# Patient Record
Sex: Female | Born: 1990 | Hispanic: No | Marital: Single | Smoking: Current every day smoker
Health system: Southern US, Community
[De-identification: ages and names within clinical notes are randomized; demographics above are authoritative.]

## PROBLEM LIST (undated history)

## (undated) ENCOUNTER — Emergency Department (HOSPITAL_COMMUNITY): Payer: No Typology Code available for payment source

## (undated) ENCOUNTER — Emergency Department (HOSPITAL_COMMUNITY): Payer: Medicaid Other

## (undated) DIAGNOSIS — A419 Sepsis, unspecified organism: Secondary | ICD-10-CM

## (undated) DIAGNOSIS — B182 Chronic viral hepatitis C: Secondary | ICD-10-CM

## (undated) DIAGNOSIS — Z8679 Personal history of other diseases of the circulatory system: Secondary | ICD-10-CM

## (undated) DIAGNOSIS — F1910 Other psychoactive substance abuse, uncomplicated: Secondary | ICD-10-CM

## (undated) HISTORY — PX: BLADDER SURGERY: SHX569

---
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
## 2016-07-28 DIAGNOSIS — N189 Chronic kidney disease, unspecified: Secondary | ICD-10-CM

## 2016-07-28 HISTORY — DX: Chronic kidney disease, unspecified: N18.9

## 2020-01-02 ENCOUNTER — Other Ambulatory Visit: Payer: Self-pay

## 2020-01-02 ENCOUNTER — Encounter (HOSPITAL_COMMUNITY)
Admission: EM | Disposition: A | Discharge status: Home / Self Care | Payer: Self-pay | Source: Home / Self Care | Attending: Emergency Medicine

## 2020-01-02 ENCOUNTER — Other Ambulatory Visit (HOSPITAL_COMMUNITY): Payer: Medicaid Other

## 2020-01-02 ENCOUNTER — Ambulatory Visit (HOSPITAL_COMMUNITY)
Admission: EM | Admit: 2020-01-02 | Discharge: 2020-01-02 | Disposition: A | Discharge status: Home / Self Care | Payer: Medicaid Other | Attending: Emergency Medicine | Admitting: Emergency Medicine

## 2020-01-02 ENCOUNTER — Encounter (HOSPITAL_COMMUNITY): Payer: Self-pay | Admitting: Obstetrics and Gynecology

## 2020-01-02 ENCOUNTER — Emergency Department (HOSPITAL_COMMUNITY): Payer: Medicaid Other | Admitting: Certified Registered Nurse Anesthetist

## 2020-01-02 ENCOUNTER — Emergency Department (HOSPITAL_COMMUNITY): Payer: Medicaid Other

## 2020-01-02 DIAGNOSIS — N939 Abnormal uterine and vaginal bleeding, unspecified: Secondary | ICD-10-CM | POA: Diagnosis not present

## 2020-01-02 DIAGNOSIS — O071 Delayed or excessive hemorrhage following failed attempted termination of pregnancy: Secondary | ICD-10-CM | POA: Insufficient documentation

## 2020-01-02 DIAGNOSIS — O034 Incomplete spontaneous abortion without complication: Secondary | ICD-10-CM

## 2020-01-02 DIAGNOSIS — O046 Delayed or excessive hemorrhage following (induced) termination of pregnancy: Secondary | ICD-10-CM

## 2020-01-02 DIAGNOSIS — Z20822 Contact with and (suspected) exposure to covid-19: Secondary | ICD-10-CM | POA: Diagnosis not present

## 2020-01-02 DIAGNOSIS — R109 Unspecified abdominal pain: Secondary | ICD-10-CM | POA: Diagnosis not present

## 2020-01-02 DIAGNOSIS — D649 Anemia, unspecified: Secondary | ICD-10-CM

## 2020-01-02 DIAGNOSIS — IMO0002 Reserved for concepts with insufficient information to code with codable children: Secondary | ICD-10-CM

## 2020-01-02 DIAGNOSIS — Z9889 Other specified postprocedural states: Secondary | ICD-10-CM

## 2020-01-02 HISTORY — PX: DILATION AND EVACUATION: SHX1459

## 2020-01-02 HISTORY — DX: Other psychoactive substance abuse, uncomplicated: F19.10

## 2020-01-02 HISTORY — DX: Chronic viral hepatitis C: B18.2

## 2020-01-02 LAB — CBC
HCT: 27.7 % — ABNORMAL LOW (ref 36.0–46.0)
Hemoglobin: 9 g/dL — ABNORMAL LOW (ref 12.0–15.0)
MCH: 27.5 pg (ref 26.0–34.0)
MCHC: 32.5 g/dL (ref 30.0–36.0)
MCV: 84.7 fL (ref 80.0–100.0)
Platelets: 374 10*3/uL (ref 150–400)
RBC: 3.27 MIL/uL — ABNORMAL LOW (ref 3.87–5.11)
RDW: 14.2 % (ref 11.5–15.5)
WBC: 14 10*3/uL — ABNORMAL HIGH (ref 4.0–10.5)
nRBC: 0 % (ref 0.0–0.2)

## 2020-01-02 LAB — CBC WITH DIFFERENTIAL/PLATELET
Abs Immature Granulocytes: 0.05 10*3/uL (ref 0.00–0.07)
Basophils Absolute: 0.1 10*3/uL (ref 0.0–0.1)
Basophils Relative: 1 %
Eosinophils Absolute: 0.1 10*3/uL (ref 0.0–0.5)
Eosinophils Relative: 1 %
HCT: 23.1 % — ABNORMAL LOW (ref 36.0–46.0)
Hemoglobin: 7.3 g/dL — ABNORMAL LOW (ref 12.0–15.0)
Immature Granulocytes: 0 %
Lymphocytes Relative: 14 %
Lymphs Abs: 1.9 10*3/uL (ref 0.7–4.0)
MCH: 25.5 pg — ABNORMAL LOW (ref 26.0–34.0)
MCHC: 31.6 g/dL (ref 30.0–36.0)
MCV: 80.8 fL (ref 80.0–100.0)
Monocytes Absolute: 0.4 10*3/uL (ref 0.1–1.0)
Monocytes Relative: 3 %
Neutro Abs: 10.6 10*3/uL — ABNORMAL HIGH (ref 1.7–7.7)
Neutrophils Relative %: 81 %
Platelets: 511 10*3/uL — ABNORMAL HIGH (ref 150–400)
RBC: 2.86 MIL/uL — ABNORMAL LOW (ref 3.87–5.11)
RDW: 13.2 % (ref 11.5–15.5)
WBC: 13 10*3/uL — ABNORMAL HIGH (ref 4.0–10.5)
nRBC: 0 % (ref 0.0–0.2)

## 2020-01-02 LAB — COMPREHENSIVE METABOLIC PANEL
ALT: 25 U/L (ref 0–44)
AST: 28 U/L (ref 15–41)
Albumin: 3.6 g/dL (ref 3.5–5.0)
Alkaline Phosphatase: 54 U/L (ref 38–126)
Anion gap: 10 (ref 5–15)
BUN: 7 mg/dL (ref 6–20)
CO2: 23 mmol/L (ref 22–32)
Calcium: 8.8 mg/dL — ABNORMAL LOW (ref 8.9–10.3)
Chloride: 105 mmol/L (ref 98–111)
Creatinine, Ser: 0.74 mg/dL (ref 0.44–1.00)
GFR calc Af Amer: >60 mL/min (ref >60)
GFR calc non Af Amer: >60 mL/min (ref >60)
Glucose, Bld: 142 mg/dL — ABNORMAL HIGH (ref 70–99)
Potassium: 4 mmol/L (ref 3.5–5.1)
Sodium: 138 mmol/L (ref 135–145)
Total Bilirubin: 0.3 mg/dL (ref 0.3–1.2)
Total Protein: 7 g/dL (ref 6.5–8.1)

## 2020-01-02 LAB — POCT I-STAT, CHEM 8
BUN: 7 mg/dL (ref 6–20)
Calcium, Ion: 1.19 mmol/L (ref 1.15–1.40)
Chloride: 104 mmol/L (ref 98–111)
Creatinine, Ser: 0.5 mg/dL (ref 0.44–1.00)
Glucose, Bld: 97 mg/dL (ref 70–99)
HCT: 21 % — ABNORMAL LOW (ref 36.0–46.0)
Hemoglobin: 7.1 g/dL — ABNORMAL LOW (ref 12.0–15.0)
Potassium: 4.3 mmol/L (ref 3.5–5.1)
Sodium: 139 mmol/L (ref 135–145)
TCO2: 22 mmol/L (ref 22–32)

## 2020-01-02 LAB — RESP PANEL BY RT PCR (RSV, FLU A&B, COVID)
Influenza A by PCR: NEGATIVE
Influenza B by PCR: NEGATIVE
Respiratory Syncytial Virus by PCR: NEGATIVE
SARS Coronavirus 2 by RT PCR: NEGATIVE

## 2020-01-02 LAB — PREPARE RBC (CROSSMATCH)

## 2020-01-02 LAB — ABO/RH: ABO/RH(D): O POS

## 2020-01-02 LAB — I-STAT BETA HCG BLOOD, ED (MC, WL, AP ONLY): I-stat hCG, quantitative: 817.1 m[IU]/mL — ABNORMAL HIGH (ref <5)

## 2020-01-02 SURGERY — DILATION AND EVACUATION, UTERUS
Anesthesia: General

## 2020-01-02 MED ORDER — SODIUM CHLORIDE 0.9 % IV SOLN
100.0000 mg | Freq: Once | INTRAVENOUS | Status: AC
  Administered 2020-01-02: 100 mg via INTRAVENOUS
  Filled 2020-01-02: qty 100

## 2020-01-02 MED ORDER — DEXAMETHASONE SODIUM PHOSPHATE 4 MG/ML IJ SOLN
INTRAMUSCULAR | Status: DC | PRN
  Administered 2020-01-02: 5 mg via INTRAVENOUS

## 2020-01-02 MED ORDER — SODIUM CHLORIDE 0.9 % IV BOLUS
1000.0000 mL | Freq: Once | INTRAVENOUS | Status: AC
  Administered 2020-01-02: 1000 mL via INTRAVENOUS

## 2020-01-02 MED ORDER — MIDAZOLAM HCL 5 MG/5ML IJ SOLN
INTRAMUSCULAR | Status: DC | PRN
  Administered 2020-01-02: 2 mg via INTRAVENOUS

## 2020-01-02 MED ORDER — SUCCINYLCHOLINE CHLORIDE 20 MG/ML IJ SOLN
INTRAMUSCULAR | Status: DC | PRN
  Administered 2020-01-02: 100 mg via INTRAVENOUS

## 2020-01-02 MED ORDER — PHENYLEPHRINE HCL (PRESSORS) 10 MG/ML IV SOLN
INTRAVENOUS | Status: DC | PRN
  Administered 2020-01-02: 80 ug via INTRAVENOUS

## 2020-01-02 MED ORDER — FENTANYL CITRATE (PF) 100 MCG/2ML IJ SOLN: 25.0000 ug | INTRAMUSCULAR | Status: DC | PRN

## 2020-01-02 MED ORDER — LACTATED RINGERS IV SOLN: INTRAVENOUS | Status: DC

## 2020-01-02 MED ORDER — LIDOCAINE HCL (CARDIAC) PF 100 MG/5ML IV SOSY
PREFILLED_SYRINGE | INTRAVENOUS | Status: DC | PRN
  Administered 2020-01-02: 100 mg via INTRAVENOUS

## 2020-01-02 MED ORDER — PROPOFOL 10 MG/ML IV BOLUS
INTRAVENOUS | Status: AC
  Filled 2020-01-02: qty 20

## 2020-01-02 MED ORDER — LIDOCAINE HCL (PF) 1 % IJ SOLN
INTRAMUSCULAR | Status: AC
  Filled 2020-01-02: qty 30

## 2020-01-02 MED ORDER — LIDOCAINE HCL 1 % IJ SOLN
INTRAMUSCULAR | Status: DC | PRN
  Administered 2020-01-02: 20 mL

## 2020-01-02 MED ORDER — TRANEXAMIC ACID-NACL 1000-0.7 MG/100ML-% IV SOLN
1000.0000 mg | Freq: Once | INTRAVENOUS | Status: AC
  Administered 2020-01-02: 1000 mg via INTRAVENOUS
  Filled 2020-01-02: qty 100

## 2020-01-02 MED ORDER — FENTANYL CITRATE (PF) 100 MCG/2ML IJ SOLN
INTRAMUSCULAR | Status: DC | PRN
  Administered 2020-01-02: 50 ug via INTRAVENOUS
  Administered 2020-01-02: 100 ug via INTRAVENOUS

## 2020-01-02 MED ORDER — ONDANSETRON HCL 4 MG/2ML IJ SOLN
INTRAMUSCULAR | Status: DC | PRN
  Administered 2020-01-02: 4 mg via INTRAVENOUS

## 2020-01-02 MED ORDER — PROPOFOL 10 MG/ML IV BOLUS
INTRAVENOUS | Status: DC | PRN
  Administered 2020-01-02: 150 mg via INTRAVENOUS

## 2020-01-02 MED ORDER — ONDANSETRON HCL 4 MG/2ML IJ SOLN
4.0000 mg | Freq: Once | INTRAMUSCULAR | Status: AC
  Administered 2020-01-02: 4 mg via INTRAVENOUS
  Filled 2020-01-02: qty 2

## 2020-01-02 MED ORDER — ORAL CARE MOUTH RINSE: 15.0000 mL | Freq: Once | OROMUCOSAL | Status: AC

## 2020-01-02 MED ORDER — SODIUM CHLORIDE 0.9 % IV SOLN
10.0000 mL/h | Freq: Once | INTRAVENOUS | Status: AC
  Administered 2020-01-02: 10 mL/h via INTRAVENOUS

## 2020-01-02 MED ORDER — SODIUM CHLORIDE 0.9 % IV SOLN: INTRAVENOUS | Status: DC

## 2020-01-02 MED ORDER — 0.9 % SODIUM CHLORIDE (POUR BTL) OPTIME
TOPICAL | Status: DC | PRN
  Administered 2020-01-02: 1000 mL

## 2020-01-02 MED ORDER — FENTANYL CITRATE (PF) 250 MCG/5ML IJ SOLN
INTRAMUSCULAR | Status: AC
  Filled 2020-01-02: qty 5

## 2020-01-02 MED ORDER — MIDAZOLAM HCL 2 MG/2ML IJ SOLN
INTRAMUSCULAR | Status: AC
  Filled 2020-01-02: qty 2

## 2020-01-02 MED ORDER — CHLORHEXIDINE GLUCONATE 0.12 % MT SOLN: 15.0000 mL | Freq: Once | OROMUCOSAL | Status: AC

## 2020-01-02 MED ORDER — POLYETHYLENE GLYCOL 3350 17 G PO PACK: 17.0000 g | PACK | Freq: Every day | ORAL | 1 refills | Status: AC

## 2020-01-02 MED ORDER — POLYSACCHARIDE IRON COMPLEX 150 MG PO CAPS: 150.0000 mg | ORAL_CAPSULE | Freq: Every day | ORAL | 0 refills | Status: DC

## 2020-01-02 MED ORDER — IBUPROFEN 600 MG PO TABS
600.0000 mg | ORAL_TABLET | Freq: Four times a day (QID) | ORAL | 0 refills | Status: DC | PRN
Start: 2020-01-02 — End: 2021-12-08

## 2020-01-02 MED ORDER — CHLORHEXIDINE GLUCONATE 0.12 % MT SOLN
OROMUCOSAL | Status: AC
  Administered 2020-01-02: 15 mL via OROMUCOSAL
  Filled 2020-01-02: qty 15

## 2020-01-02 SURGICAL SUPPLY — 25 items
CATH ROBINSON RED A/P 16FR (CATHETERS) ×2 IMPLANT
DECANTER SPIKE VIAL GLASS SM (MISCELLANEOUS) ×3 IMPLANT
FILTER UTR ASPR ASSEMBLY (MISCELLANEOUS) ×3 IMPLANT
GLOVE BIOGEL PI IND STRL 7.0 (GLOVE) ×1 IMPLANT
GLOVE BIOGEL PI IND STRL 7.5 (GLOVE) ×1 IMPLANT
GLOVE BIOGEL PI INDICATOR 7.0 (GLOVE) ×2
GLOVE BIOGEL PI INDICATOR 7.5 (GLOVE) ×2
GLOVE NEODERM STER SZ 7 (GLOVE) ×3 IMPLANT
GOWN STRL REUS W/ TWL LRG LVL3 (GOWN DISPOSABLE) ×1 IMPLANT
GOWN STRL REUS W/ TWL XL LVL3 (GOWN DISPOSABLE) ×1 IMPLANT
GOWN STRL REUS W/TWL LRG LVL3 (GOWN DISPOSABLE) ×3
GOWN STRL REUS W/TWL XL LVL3 (GOWN DISPOSABLE) ×3
HOSE CONNECTING 18IN BERKELEY (TUBING) ×3 IMPLANT
KIT BERKELEY 1ST TRI 3/8 NO TR (MISCELLANEOUS) ×3 IMPLANT
KIT BERKELEY 1ST TRIMESTER 3/8 (MISCELLANEOUS) ×3 IMPLANT
NS IRRIG 1000ML POUR BTL (IV SOLUTION) ×3 IMPLANT
PACK VAGINAL MINOR WOMEN LF (CUSTOM PROCEDURE TRAY) ×3 IMPLANT
PAD OB MATERNITY 4.3X12.25 (PERSONAL CARE ITEMS) ×3 IMPLANT
SET BERKELEY SUCTION TUBING (SUCTIONS) ×3 IMPLANT
TOWEL GREEN STERILE FF (TOWEL DISPOSABLE) ×6 IMPLANT
VACURETTE 10 RIGID CVD (CANNULA) ×2 IMPLANT
VACURETTE 12 RIGID CVD (CANNULA) ×2 IMPLANT
VACURETTE 7MM CVD STRL WRAP (CANNULA) IMPLANT
VACURETTE 8 RIGID CVD (CANNULA) IMPLANT
VACURETTE 9 RIGID CVD (CANNULA) IMPLANT

## 2020-01-02 NOTE — Discharge Instructions (Signed)
   We will discuss your surgery once again in detail at your post-op visit in two to four weeks. If you haven't already done so, please call to make your appointment as soon as possible.  Dilation and Curettage or Vacuum Curettage, Care After These instructions give you information on caring for yourself after your procedure. Your doctor may also give you more specific instructions. Call your doctor if you have any problems or questions after your procedure. HOME CARE Do not drive for 24 hours. Wait 1 week before doing any activities that wear you out. Do not stand for a long time. Limit stair climbing to once or twice a day. Rest often. Continue with your usual diet. Drink enough fluids to keep your pee (urine) clear or pale yellow. If you have a hard time pooping (constipation), you may: Take a medicine to help you go poop (laxative) as told by your doctor. Eat more fruit and bran. Drink more fluids. Take showers, not baths, for as long as told by your doctor. Do not swim or use a hot tub until your doctor says it is okay. Have someone with you for 1day after the procedure. Do not douche, use tampons, or have sex (intercourse) until seen by your doctor Only take medicines as told by your doctor. Do not take aspirin. It can cause bleeding. Keep all doctor visits. GET HELP IF: You have cramps or pain not helped by medicine. You have new pain in the belly (abdomen). You have a bad smelling fluid coming from your vagina. You have a rash. You have problems with any medicine. GET HELP RIGHT AWAY IF:  You start to bleed more than a regular period. You have a fever. You have chest pain. You have trouble breathing. You feel dizzy or feel like passing out (fainting). You pass out. You have pain in the tops of your shoulders. You have vaginal bleeding with or without clumps of blood (blood clots). MAKE SURE YOU: Understand these instructions. Will watch your condition. Will get help  right away if you are not doing well or get worse. Document Released: 04/22/2008 Document Revised: 07/19/2013 Document Reviewed: 02/10/2013 ExitCare Patient Information 2015 [employer]. This information is not intended to replace advice given to you by your health care provider. Make sure you discuss any questions you have with your health care provider.   

## 2020-01-02 NOTE — ED Provider Notes (Signed)
[facility] EMERGENCY DEPARTMENT Provider Note   CSN: [id number] Arrival date & time: 01/02/20  1213     History Chief Complaint  Patient presents with  . Vaginal Bleeding    Sandra Ritter is a 29 y.o. female with a history of an induced abortion on 5/19 at approx [redacted]w[redacted]d gestation present emergency department with dizziness continued vaginal bleeding.  Patient reports that she has had nearly continuous daily vaginal bleeding since her induced abortion.  She does feel she passed tissue after the abortion and has significant cramping, however she continues to have cramping and passing clots the size grapefruits daily.  Says she feels like she cannot breathe and she feels lightheaded.  She reports continued cramping abdominal pain.  She reports possible fevers a week ago, but denies persistent fevers the past several days, denies any purulent vaginal discharge.  She reports past medical history of hepatitis C for which he follows at [facility], as well as coronary vasospasms.  She does not actively have an OB/GYN provider.  HPI     No past medical history on file.  There are no problems to display for this patient.   OB History   No obstetric history on file.     No family history on file.  Social History   Tobacco Use  . Smoking status: Not on file  Substance Use Topics  . Alcohol use: Not on file  . Drug use: Not on file    Home Medications Prior to Admission medications   Medication Sig Start Date End Date Taking? Authorizing Provider  nitroGLYCERIN (NITROSTAT) 0.4 MG SL tablet Place 0.4 mg under the tongue every 5 (five) minutes as needed for chest pain.  10/21/19  Yes [provider]  SUBOXONE 8-2 MG FILM Place 0.5 Film under the tongue every 12 (twelve) hours. 12/07/19  Yes [provider]    Allergies    Patient has no known allergies.  Review of Systems   Review of Systems  Constitutional: Negative for chills and fever.    Eyes: Negative for photophobia and visual disturbance.  Respiratory: Negative for cough and shortness of breath.   Cardiovascular: Negative for chest pain and palpitations.  Gastrointestinal: Positive for abdominal pain and nausea. Negative for vomiting.  Genitourinary: Positive for pelvic pain and vaginal bleeding.  Musculoskeletal: Negative for arthralgias and back pain.  Skin: Negative for color change and rash.  Neurological: Positive for dizziness and light-headedness. Negative for syncope.  All other systems reviewed and are negative.   Physical Exam Updated Vital Signs BP 102/69   Pulse (!) 110   Temp 98.2 F (36.8 C) (Oral)   Resp 17   Ht 5\' 5"  (1.651 m)   Wt 66.2 kg   SpO2 97%   BMI 24.30 kg/m   Physical Exam Vitals and nursing note reviewed.  Constitutional:      Appearance: She is well-developed.     Comments: Appears tired, eyes closed  HENT:     Head: Normocephalic and atraumatic.  Eyes:     Conjunctiva/sclera: Conjunctivae normal.     Comments: No conjunctival pallor  Cardiovascular:     Rate and Rhythm: Regular rhythm. Tachycardia present.     Pulses: Normal pulses.  Pulmonary:     Effort: Pulmonary effort is normal. No respiratory distress.     Breath sounds: Normal breath sounds.  Abdominal:     Palpations: Abdomen is soft.     Tenderness: There is no abdominal tenderness. There  is no guarding.  Genitourinary:    Comments: Exam performed with chaperone present, nurse April Oakley External: Normal external female genitalia. No lesions, rashes, drainage, or suspicious lymph nodes. Internal: moderate to large amount of blood and large clots in vaginal vault, slow active bleed from cervical os,  Musculoskeletal:     Cervical back: Neck supple.  Skin:    General: Skin is warm and dry.  Neurological:     General: No focal deficit present.     Mental Status: She is alert and oriented to person, place, and time.  Psychiatric:        Mood and Affect:  Mood normal.        Behavior: Behavior normal.     ED Results / Procedures / Treatments   Labs (all labs ordered are listed, but only abnormal results are displayed) Labs Reviewed  CBC WITH DIFFERENTIAL/PLATELET - Abnormal; Notable for the following components:      Result Value   WBC 13.0 (*)    RBC 2.86 (*)    Hemoglobin 7.3 (*)    HCT 23.1 (*)    MCH 25.5 (*)    Platelets 511 (*)    Neutro Abs 10.6 (*)    All other components within normal limits  COMPREHENSIVE METABOLIC PANEL - Abnormal; Notable for the following components:   Glucose, Bld 142 (*)    Calcium 8.8 (*)    All other components within normal limits  I-STAT BETA HCG BLOOD, ED ([facility], [facility], [facility] ONLY) - Abnormal; Notable for the following components:   I-stat hCG, quantitative 817.1 (*)    All other components within normal limits  TYPE AND SCREEN  PREPARE RBC (CROSSMATCH)  ABO/RH    EKG EKG Interpretation  Date/Time:  Monday January 02 2020 12:45:03 EDT Ventricular Rate:  146 PR Interval:    QRS Duration: 80 QT Interval:  309 QTC Calculation: 482 R Axis:   79 Text Interpretation: Sinus tachycardia Repol abnrm suggests ischemia, inferior leads Baseline wander in lead(s) V6 No stemi, V6 baseline off Confirmed by Octaviano Glow [phone]) on 01/02/2020 2:44:58 PM   Radiology No results found.  Procedures .Critical Care Performed by: Wyvonnia Dusky, MD Authorized by: Wyvonnia Dusky, MD   Critical care provider statement:    Critical care time (minutes):  45   Critical care was necessary to treat or prevent imminent or life-threatening deterioration of the following conditions:  Circulatory failure   Critical care was time spent personally by me on the following activities:  Discussions with consultants, evaluation of patient's response to treatment, examination of patient, ordering and performing treatments and interventions, ordering and review of laboratory studies, ordering and review of radiographic  studies, pulse oximetry, re-evaluation of patient's condition, obtaining history from patient or surrogate and review of old charts Comments:     Symptomatic anemia requiring IV blood transfusion   (including critical care time)  Medications Ordered in ED Medications  sodium chloride 0.9 % bolus 1,000 mL (0 mLs Intravenous Stopped 01/02/20 1501)  0.9 %  sodium chloride infusion (10 mL/hr Intravenous New Bag/Given 01/02/20 1534)  tranexamic acid (CYKLOKAPRON) IVPB 1,000 mg (0 mg Intravenous Stopped 01/02/20 1512)  ondansetron (ZOFRAN) injection 4 mg (4 mg Intravenous Given 01/02/20 1521)    ED Course  I have reviewed the triage vital signs and the nursing notes.  Pertinent labs & imaging results that were available during my care of the patient were reviewed by me and considered in my medical decision making (  see chart for details).  29 yo female presenting with vaginal bleeding after medically-induced abortion on 12/14/19, persistent cramping and passing daily clots since then.  She is tachycardic on arrival, lightheaded, and has continued bleeding from her cervical os.  This is clinically concerning for retained products of conception or an incomplete abortion.  She is afebrile and does not appear septic on exam.    Hgb 7.3. I ordered IV fluids, 1 g IV txa, and 2 units pRBC after verbally consenting the patient for a transfusion. Consulted with OBGYN provider as documented below and ordered stat OB pelvic ultrasound.  Patient otherwise stable from blood pressure perspective, does not appear to be in shock, and is not requiring massive transfusion on initial presentation.  I suspect this has been a gradual bleed for the past 3 weeks and not an acute hemorrhage from her description of events.    Clinical Course as of Jan 01 1630  Mon Jan 02, 2020  1308 USIV placed, starting fluids now   [MT]  1405 Spoke to Dr Sherryl Manges on call faculty provider and reviewed the patient's presentation,  vitals, and anemic state. Dr Ladean Raya requests a pelvic ultrasound to evaluate for retained POC, agrees with transfusion of pRBC, asks to be notified immediately if the patient's blood pressure drops, her hemorrhaging worsens, or after her ultrasound is complete.  The patient was subsequently consented for blood transfusion.    [MT]  1553 Signed out ot Dr Particia Nearing EDP with plan to f/u on stat pelvic ultrasound which is pending now, patient receiving blood transfusions, HR improving, BP stable at time of signout.  Plan to contact Dr Jolayne Panther with results following ultrasound, anticipate need for admission.   [MT]    Clinical Course User Index [MT] Sejal Cofield, Kermit Balo, MD    Final Clinical Impression(s) / ED Diagnoses Final diagnoses:  Uterine bleeding  Symptomatic anemia    Rx / DC Orders ED Discharge Orders    None       Terald Sleeper, MD 01/02/20 [phone]

## 2020-01-02 NOTE — Transfer of Care (Addendum)
Immediate Anesthesia Transfer of Care Note  Patient: Sandra Ritter  Procedure(s) Performed: Suction D&C (N/A )  Patient Location: PACU  Anesthesia Type:General  Level of Consciousness: awake, alert , oriented and patient cooperative  Airway & Oxygen Therapy: Patient Spontanous Breathing  Post-op Assessment: Report given to RN and Post -op Vital signs reviewed and stable  Post vital signs: Reviewed and stable  Last Vitals:  Vitals Value Taken Time  BP 112/72 01/02/20 2046  Temp 37.1 C 01/02/20 2045  Pulse 96 01/02/20 2052  Resp 17 01/02/20 2052  SpO2 100 % 01/02/20 2052  Vitals shown include unvalidated device data.  Last Pain:  Vitals:   01/02/20 2045  TempSrc:   PainSc: 5          Complications: No apparent anesthesia complications

## 2020-01-02 NOTE — ED Notes (Signed)
Blood transfusion began. Pt lethargic and stating that she is having pain in vaginal area. Still alert and oriented. Mother at bedside, explained blood transfusion and plan of care to both.

## 2020-01-02 NOTE — Consult Note (Addendum)
Obstetrics & Gynecology Consult Note  Date of Consult: 01/02/2020   Requesting Provider: [facility]  Primary OBGYN: None Primary Care Provider: Algis Greenhouse  Reason for Consult: VB, retained POCs on ultrasound   History of Present Illness: Ms. Sandra Ritter is a 29 y.o. G2P1011 with above CC. She had an elective AB at [facility] on 5/19 via medication.  She states she saw them about two weeks later b/c of having VB and she said they took some clots out. She then saw them about a week ago and she said they did a UPT and it was negative so nothing else needed to be done.  Patient anemic with positive hcg here in the ED. U/s read not back yet but on my reivew I see retained POCs.   Pt getting transfused 2U PRBC currently.   ROS: A 12-point review of systems was performed and negative, except as stated in the above HPI.  OBGYN History: As per HPI. OB History  Gravida Para Term Preterm AB Living  2 1 1   1 1   SAB TAB Ectopic Multiple Live Births               # Outcome Date GA Lbr Len/2nd Weight Sex Delivery Anes PTL Lv  2 Term           1 AB             Obstetric Comments  G1 2020: SVD, no issues  G2 may 2021: 9wk elective ab via meds at [facility]     Past Medical History: Past Medical History:  Diagnosis Date  . Chronic hepatitis C ([place])   . Substance abuse [facility])     Past Surgical History: Past Surgical History:  Procedure Laterality Date  . BLADDER SURGERY     "bladder tack" age 61    Family History:  No family history on file.  Social History:  Social History   Socioeconomic History  . Marital status: Single    Spouse name: Not on file  . Number of children: Not on file  . Years of education: Not on file  . Highest education level: Not on file  Occupational History  . Not on file  Tobacco Use  . Smoking status: Not on file  Substance and Sexual Activity  . Alcohol use: Not on file  . Drug use: Not on file  . Sexual activity: Not on file   Other Topics Concern  . Not on file  Social History Narrative  . Not on file   Social Determinants of Health   Financial Resource Strain:   . Difficulty of Paying Living Expenses:   Food Insecurity:   . Worried About Charity fundraiser in the Last Year:   . Arboriculturist in the Last Year:   Transportation Needs:   . Film/video editor (Medical):   Marland Kitchen Lack of Transportation (Non-Medical):   Physical Activity:   . Days of Exercise per Week:   . Minutes of Exercise per Session:   Stress:   . Feeling of Stress :   Social Connections:   . Frequency of Communication with Friends and Family:   . Frequency of Social Gatherings with Friends and Family:   . Attends Religious Services:   . Active Member of Clubs or Organizations:   . Attends Archivist Meetings:   Marland Kitchen Marital Status:   Intimate Partner Violence:   . Fear of Current or Ex-Partner:   .  Emotionally Abused:   Marland Kitchen Physically Abused:   . Sexually Abused:     Allergy: No Known Allergies  Current Outpatient Medications: No current facility-administered medications for this encounter.   Current Outpatient Medications  Medication Sig Dispense Refill  . nitroGLYCERIN (NITROSTAT) 0.4 MG SL tablet Place 0.4 mg under the tongue every 5 (five) minutes as needed for chest pain.     . SUBOXONE 8-2 MG FILM Place 0.5 Film under the tongue every 12 (twelve) hours.       Physical Exam:   Current Vital Signs 24h Vital Sign Ranges  T 98.2 F (36.8 C) Temp  Avg: 98.8 F (37.1 C)  Min: 98.2 F (36.8 C)  Max: 99.3 F (37.4 C)  BP 114/77 BP  Min: 82/73  Max: 124/93  HR 97 Pulse  Avg: 119.7  Min: 97  Max: 164  RR 12 Resp  Avg: 16.7  Min: 12  Max: 25  SaO2 100 % Room Air SpO2  Avg: 98.6 %  Min: 96 %  Max: 100 %       24 Hour I/O Current Shift I/O  Time Ins Outs No intake/output data recorded. No intake/output data recorded.   Patient Vitals for the past 24 hrs:  BP Temp Temp src Pulse Resp SpO2 Height Weight   01/02/20 1730 114/77 -- -- 97 12 100 % -- --  01/02/20 1715 109/80 -- -- (!) 101 15 100 % -- --  01/02/20 1700 98/68 -- -- 100 14 100 % -- --  01/02/20 1645 98/68 -- -- (!) 107 20 98 % -- --  01/02/20 1630 98/67 -- -- (!) 110 13 96 % -- --  01/02/20 1615 101/68 -- -- (!) 110 18 97 % -- --  01/02/20 1600 102/69 -- -- (!) 110 17 97 % -- --  01/02/20 1550 103/62 98.2 F (36.8 C) Oral (!) 109 15 98 % -- --  01/02/20 1545 100/66 -- -- (!) 113 17 96 % -- --  01/02/20 1536 95/64 -- -- (!) 110 13 98 % -- --  01/02/20 1530 (!) 82/73 -- -- (!) 111 13 99 % -- --  01/02/20 1515 106/73 99.3 F (37.4 C) Oral (!) 121 16 97 % -- --  01/02/20 1500 107/77 -- -- (!) 121 15 100 % -- --  01/02/20 1445 104/76 -- -- (!) 123 18 100 % -- --  01/02/20 1430 109/75 -- -- (!) 122 18 97 % -- --  01/02/20 1415 115/83 -- -- (!) 122 14 100 % -- --  01/02/20 1318 121/71 -- -- (!) 142 (!) 25 100 % -- --  01/02/20 1245 (!) 123/92 -- -- (!) 146 14 99 % -- --  01/02/20 1225 -- -- -- -- -- -- 5\' 5"  (1.651 m) 66.2 kg  01/02/20 1224 -- 99.1 F (37.3 C) Oral (!) 155 (!) 22 100 % -- --  01/02/20 1222 (!) 124/93 98.7 F (37.1 C) Oral (!) 164 (!) 24 100 % -- --    Body mass index is 24.3 kg/m. General appearance: Well nourished, well developed female in no acute distress.  Cardiovascular: S1, S2 normal, no murmur, rub or gallop, regular rate and rhythm Respiratory:  Clear to auscultation bilateral. Normal respiratory effort Abdomen: positive bowel sounds and no masses, hernias; diffusely non tender to palpation, non distended Neuro/Psych:  Normal mood and affect.  Skin:  Warm and dry.  Extremities: no clubbing, cyanosis, or edema.   VB and clots seen on ER  MD exam  Laboratory: istat Beta HCG: 817 Recent Labs  Lab 01/02/20 1305  WBC 13.0*  HGB 7.3*  HCT 23.1*  PLT 511*   Recent Labs  Lab 01/02/20 1305  NA 138  K 4.0  CL 105  CO2 23  BUN 7  CREATININE 0.74  CALCIUM 8.8*  PROT 7.0  BILITOT 0.3   ALKPHOS 54  ALT 25  AST 28  GLUCOSE 142*   No results for input(s): APTT, INR, PTT in the last 168 hours.  Invalid input(s): DRHAPTT Recent Labs  Lab 01/02/20 1305  ABORH O POS  O POS Performed at [facility], [address], [place], [place] [address]     Imaging:  See above  Assessment: Ms. Sandra Ritter is a 29 y.o. G2P1011 with rPOCs, anemia. Pt stable Plan: D/w her I recommend suction d&c, which she is amenable to. Pt has been NPO since before MN. COVID swab ordered. D/w her that she should be able to go home later today. STD swab ordered   Total time taking care of the patient was 30 minutes, with greater than 50% of the time spent in face to face interaction with the patient.  Cornelia Copa MD Attending Center for [facility])

## 2020-01-02 NOTE — Anesthesia Postprocedure Evaluation (Signed)
Anesthesia Post Note  Patient: Sandra Ritter  Procedure(s) Performed: Suction D&C (N/A )     Patient location during evaluation: PACU Anesthesia Type: General Level of consciousness: awake Pain management: pain level controlled Vital Signs Assessment: post-procedure vital signs reviewed and stable Respiratory status: spontaneous breathing Cardiovascular status: stable Postop Assessment: no apparent nausea or vomiting Anesthetic complications: no    Last Vitals:  Vitals:   01/02/20 2100 01/02/20 2115  BP: 119/78 116/73  Pulse: 98 97  Resp: 16 13  Temp:  37.1 C  SpO2: 100% 100%    Last Pain:  Vitals:   01/02/20 2115  TempSrc:   PainSc: 4                  Yancey Pedley

## 2020-01-02 NOTE — ED Notes (Signed)
[place] to be performed @ bedside due to patient receiving blood transfusion

## 2020-01-02 NOTE — Op Note (Signed)
Operative Note   01/02/2020  PRE-OP DIAGNOSIS: Retained products of conception after a medical elective abortion   POST-OP DIAGNOSIS: Same  SURGEON: Surgeon(s) and Role:    [place] Bing, MD - Primary  ASSISTANT: None  PROCEDURE:  Suction dilation and curettage  ANESTHESIA: General and paracervical block  ESTIMATED BLOOD LOSS: 3mL  DRAINS: I/O cath done pre op  TOTAL IV FLUIDS: per anesthesia note  SPECIMENS: products of conception to pathology  VTE PROPHYLAXIS: SCDs to the bilateral lower extremities  ANTIBIOTICS: Doxycycline 100mg  IV x 1 pre op  COMPLICATIONS: none  DISPOSITION: PACU - hemodynamically stable.  CONDITION: stable  BLOOD TYPE: Conflict (See Lab Report): O POS/O POS Performed at [facility], [address], [place], [place] . Rhogam given: not applicable  FINDINGS: Exam under anesthesia revealed 8 week sized uterus with no masses and bilateral adnexa without masses or fullness.  Moderate amount of necrotic appearing products of conception were seen, with gritty texture in all four quadrants.   PROCEDURE IN DETAIL:  After informed consent was obtained, the patient was taken to the operating room where anesthesia was obtained without difficulty. The patient was positioned in the dorsal lithotomy position in [place] stirrups. The patient was examined under anesthesia, with the above noted findings.  The bi-valved speculum was placed inside the patient's vagina and the cervix visualized.  Large amount of clot and POCs were removed from the cervical os. The anterior lip of the cervix was seen and grasped with the tenaculum.  A paracervical block was achieved with 90mL of 1% lidocaine and the cervix easily passed a #12 cannula.  The suction was then calibrated to 30m and connected to the number 12 cannula, which was then introduced with the above noted findings. A gentle curettage was done at the end and yield no products of conception and  gritty texture in all four quadrants.   The suction was then done one more time to remove any remaining curettage material.   Excellent hemostasis was noted, and all instruments were removed, with excellent hemostasis noted throughout.  She was then taken out of dorsal lithotomy. The patient tolerated the procedure well.  Sponge, lap and instrument counts were correct x2.  The patient was taken to recovery room in excellent condition.  MD Attending Center for Cornelia Copa [employer])

## 2020-01-02 NOTE — Progress Notes (Signed)
VAST consulted to obtain 2nd IV access as pt is actively hemorrhaging. Pt with current IV in medial RAC placed by MD using [place]. Bilateral lower and upper arms assessed utilizing [place]; no appropriate sized vessels visualized for USGIV. Notified RN and MD. If pt needs further access, a CL is recommended.

## 2020-01-02 NOTE — ED Provider Notes (Signed)
Pt signed out by Dr. Renaye Rakers pending [place].  Pt is looking better after some blood.  [place]: IMPRESSION: 1. Findings consistent with retained products of conception within the endometrial canal. 2. Small complex left ovarian cyst.  Dr. Vergie Living will take pt to the OR for D&C.   Jacalyn Lefevre, MD 01/02/20 Harrietta Guardian

## 2020-01-02 NOTE — Anesthesia Procedure Notes (Signed)
Procedure Name: Intubation Date/Time: 01/02/2020 8:05 PM Performed by: Oletta Lamas, CRNA Pre-anesthesia Checklist: Patient identified, Emergency Drugs available, Suction available and Patient being monitored Patient Re-evaluated:Patient Re-evaluated prior to induction Oxygen Delivery Method: Circle System Utilized Preoxygenation: Pre-oxygenation with 100% oxygen Induction Type: IV induction, Rapid sequence and Cricoid Pressure applied Laryngoscope Size: Mac and 3 Grade View: Grade I Tube type: Oral Number of attempts: 1 Airway Equipment and Method: Stylet Placement Confirmation: ETT inserted through vocal cords under direct vision,  positive ETCO2 and breath sounds checked- equal and bilateral Secured at: 21 cm Tube secured with: Tape Dental Injury: Teeth and Oropharynx as per pre-operative assessment

## 2020-01-02 NOTE — Anesthesia Preprocedure Evaluation (Signed)
Anesthesia Evaluation  Patient identified by MRN, date of birth, ID band Patient awake    Reviewed: Allergy & Precautions, NPO status , Patient's Chart, lab work & pertinent test results  Airway Mallampati: II  TM Distance: >3 FB     Dental   Pulmonary neg pulmonary ROS,    breath sounds clear to auscultation       Cardiovascular negative cardio ROS   Rhythm:Regular Rate:Normal     Neuro/Psych negative neurological ROS     GI/Hepatic negative GI ROS, (+) Hepatitis -  Endo/Other  negative endocrine ROS  Renal/GU negative Renal ROS     Musculoskeletal   Abdominal   Peds  Hematology   Anesthesia Other Findings   Reproductive/Obstetrics                             Anesthesia Physical Anesthesia Plan  ASA: III  Anesthesia Plan: General   Post-op Pain Management:    Induction: Intravenous  PONV Risk Score and Plan: 3 and Ondansetron, Dexamethasone and Midazolam  Airway Management Planned: Oral ETT  Additional Equipment:   Intra-op Plan:   Post-operative Plan: Extubation in OR  Informed Consent: I have reviewed the patients History and Physical, chart, labs and discussed the procedure including the risks, benefits and alternatives for the proposed anesthesia with the patient or authorized representative who has indicated his/her understanding and acceptance.     Dental advisory given  Plan Discussed with: CRNA and Anesthesiologist  Anesthesia Plan Comments:         Anesthesia Quick Evaluation

## 2020-01-02 NOTE — ED Notes (Signed)
IV's attempted. Notified EDP to request help with [place] IV emergently.IV consult placed.

## 2020-01-02 NOTE — ED Triage Notes (Signed)
Pt arrives POV for eval of heavy vaginal bleeding since 5/19. Pt reports she had an abortion on 5/19 via pill, and since that time has had heavy bleeding w/ large amt of clotting. Pt reports dizziness, weakness. States clots are now the size of a grapefruit. HR 150 in triage,.

## 2020-01-03 LAB — TYPE AND SCREEN
ABO/RH(D): O POS
Antibody Screen: NEGATIVE
Unit division: 0
Unit division: 0

## 2020-01-03 LAB — BPAM RBC
Blood Product Expiration Date: 202106142359
Blood Product Expiration Date: 202107092359
ISSUE DATE / TIME: 202106071529
ISSUE DATE / TIME: 202106071851
Unit Type and Rh: 5100
Unit Type and Rh: 5100

## 2020-01-04 LAB — SURGICAL PATHOLOGY

## 2020-01-20 ENCOUNTER — Encounter: Payer: Self-pay | Admitting: Obstetrics and Gynecology

## 2020-01-20 ENCOUNTER — Ambulatory Visit (INDEPENDENT_AMBULATORY_CARE_PROVIDER_SITE_OTHER): Payer: Medicaid Other | Admitting: Obstetrics and Gynecology

## 2020-01-20 ENCOUNTER — Other Ambulatory Visit: Payer: Self-pay

## 2020-01-20 ENCOUNTER — Other Ambulatory Visit (HOSPITAL_COMMUNITY)
Admission: RE | Admit: 2020-01-20 | Discharge: 2020-01-20 | Disposition: A | Discharge status: Home / Self Care | Payer: Medicaid Other | Source: Ambulatory Visit | Attending: Obstetrics and Gynecology | Admitting: Obstetrics and Gynecology

## 2020-01-20 VITALS — BP 114/81 | HR 97 | Wt 146.0 lb

## 2020-01-20 DIAGNOSIS — Z09 Encounter for follow-up examination after completed treatment for conditions other than malignant neoplasm: Secondary | ICD-10-CM

## 2020-01-20 DIAGNOSIS — Z30016 Encounter for initial prescription of transdermal patch hormonal contraceptive device: Secondary | ICD-10-CM | POA: Diagnosis not present

## 2020-01-20 DIAGNOSIS — Z124 Encounter for screening for malignant neoplasm of cervix: Secondary | ICD-10-CM

## 2020-01-20 DIAGNOSIS — A749 Chlamydial infection, unspecified: Secondary | ICD-10-CM

## 2020-01-20 MED ORDER — TWIRLA 120-30 MCG/24HR TD PTWK: 1.0000 | MEDICATED_PATCH | TRANSDERMAL | 3 refills | Status: DC

## 2020-01-20 NOTE — Progress Notes (Signed)
[facility] 01/20/2020  CC: regular post op visit  Subjective:     Sandra Ritter is a 29 y.o. s/p 6/7 suction d&c for rPOCs after elective AB at an outside facility.  Pt was transfused two units PRBCs pre op in the ED prior to surgery and she was discharged to home from the PACU.  No current vaginal bleeding, gyn s/s. No period yet.    Review of Systems Pertinent items noted in HPI and remainder of comprehensive ROS otherwise negative.    Objective:    BP 114/81   Pulse 97   Wt 146 lb (66.2 kg)   BMI 24.30 kg/m  General:  alert  Abdomen: soft, bowel sounds active, non-tender  Pelvic:   EGBUS normal, vaginal vault with white d/c, normal cervix nttp, normal small normal uterus nttp. Adnexa negative     Assessment:    Doing well postoperatively. Operative findings again reviewed. Pathology report discussed.    Plan:   Post op: doing well. No issues GYN care: 10 minutes. Routine care. Pt hasn't had a pap that she can remember so one was done today.  BC options d/w her and she has no contraindications to estrogen therapy; she does smoke but has no other co-morbidities. Pt elects patch. twirla sent in.   RTC PRN  Cornelia Copa MD Attending [facility] [employer] [facility])

## 2020-01-23 ENCOUNTER — Telehealth (INDEPENDENT_AMBULATORY_CARE_PROVIDER_SITE_OTHER): Payer: Medicaid Other

## 2020-01-23 DIAGNOSIS — A749 Chlamydial infection, unspecified: Secondary | ICD-10-CM | POA: Insufficient documentation

## 2020-01-23 HISTORY — DX: Chlamydial infection, unspecified: A74.9

## 2020-01-23 LAB — CERVICOVAGINAL ANCILLARY ONLY
Bacterial Vaginitis (gardnerella): NEGATIVE
Candida Glabrata: NEGATIVE
Candida Vaginitis: NEGATIVE
Chlamydia: POSITIVE — AB
Comment: NEGATIVE
Comment: NEGATIVE
Comment: NEGATIVE
Comment: NEGATIVE
Comment: NEGATIVE
Comment: NORMAL
Neisseria Gonorrhea: NEGATIVE
Trichomonas: NEGATIVE

## 2020-01-23 LAB — CYTOLOGY - PAP: Diagnosis: NEGATIVE

## 2020-01-23 MED ORDER — AZITHROMYCIN 500 MG PO TABS
1000.0000 mg | ORAL_TABLET | Freq: Once | ORAL | 0 refills | Status: AC
Start: 2020-01-23 — End: 2020-01-23

## 2020-01-23 NOTE — Addendum Note (Signed)
Addended by: [place] Bing on: 01/23/2020 04:05 PM   Modules accepted: Orders

## 2020-01-23 NOTE — Telephone Encounter (Signed)
Pt called and left VM on nurse line requesting a call back regarding results from recent visit and MyChart assistance.   Called pt. PAP smear results given, explained vaginal swab results are not available yet. MyChart link sent via text and patient enrolled.

## 2020-01-24 NOTE — Telephone Encounter (Signed)
Vaginal swab resulted positive for Chlamydia following phone call with patient on 01/23/20. Called pt; VM left stating I am calling with results and to return my phone call or check MyChart message from provider. Call back number given.

## 2020-01-24 NOTE — Telephone Encounter (Signed)
Per chart review, pt read message from provider with results and medication instructions.

## 2020-06-05 ENCOUNTER — Encounter: Payer: Self-pay | Admitting: *Deleted

## 2021-12-08 ENCOUNTER — Inpatient Hospital Stay (HOSPITAL_COMMUNITY): Payer: No Typology Code available for payment source

## 2021-12-08 ENCOUNTER — Encounter (HOSPITAL_COMMUNITY): Payer: Self-pay | Admitting: Obstetrics & Gynecology

## 2021-12-08 ENCOUNTER — Inpatient Hospital Stay (HOSPITAL_COMMUNITY)
Admission: AD | Admit: 2021-12-08 | Discharge: 2021-12-08 | Disposition: A | Discharge status: Home / Self Care | Payer: No Typology Code available for payment source | Attending: Obstetrics & Gynecology | Admitting: Obstetrics & Gynecology

## 2021-12-08 DIAGNOSIS — O26891 Other specified pregnancy related conditions, first trimester: Secondary | ICD-10-CM | POA: Diagnosis present

## 2021-12-08 DIAGNOSIS — O209 Hemorrhage in early pregnancy, unspecified: Secondary | ICD-10-CM

## 2021-12-08 DIAGNOSIS — O3680X0 Pregnancy with inconclusive fetal viability, not applicable or unspecified: Secondary | ICD-10-CM | POA: Insufficient documentation

## 2021-12-08 DIAGNOSIS — R109 Unspecified abdominal pain: Secondary | ICD-10-CM | POA: Insufficient documentation

## 2021-12-08 DIAGNOSIS — Z3A12 12 weeks gestation of pregnancy: Secondary | ICD-10-CM | POA: Insufficient documentation

## 2021-12-08 HISTORY — DX: Sepsis, unspecified organism: A41.9

## 2021-12-08 LAB — URINALYSIS, ROUTINE W REFLEX MICROSCOPIC
Bilirubin Urine: NEGATIVE
Glucose, UA: NEGATIVE mg/dL
Ketones, ur: NEGATIVE mg/dL
Nitrite: NEGATIVE
Protein, ur: NEGATIVE mg/dL
Specific Gravity, Urine: 1.008 (ref 1.005–1.030)
pH: 6 (ref 5.0–8.0)

## 2021-12-08 LAB — POCT PREGNANCY, URINE: Preg Test, Ur: POSITIVE — AB

## 2021-12-08 LAB — CBC
HCT: 38 % (ref 36.0–46.0)
Hemoglobin: 12.9 g/dL (ref 12.0–15.0)
MCH: 26.9 pg (ref 26.0–34.0)
MCHC: 33.9 g/dL (ref 30.0–36.0)
MCV: 79.2 fL — ABNORMAL LOW (ref 80.0–100.0)
Platelets: 272 10*3/uL (ref 150–400)
RBC: 4.8 MIL/uL (ref 3.87–5.11)
RDW: 11.9 % (ref 11.5–15.5)
WBC: 5.9 10*3/uL (ref 4.0–10.5)
nRBC: 0 % (ref 0.0–0.2)

## 2021-12-08 LAB — WET PREP, GENITAL
Sperm: NONE SEEN
Trich, Wet Prep: NONE SEEN
WBC, Wet Prep HPF POC: >=10 — AB (ref <10)
Yeast Wet Prep HPF POC: NONE SEEN

## 2021-12-08 LAB — HCG, QUANTITATIVE, PREGNANCY: hCG, Beta Chain, Quant, S: 12431 m[IU]/mL — ABNORMAL HIGH

## 2021-12-08 NOTE — MAU Note (Signed)
Pt reports to mau with c/o vag. Bleeding that started as spotting 3 days ago that has gotten heavier today.  Reports intercourse 2 days ago.  Reports mild abd pain 4/10 ?

## 2021-12-08 NOTE — MAU Provider Note (Signed)
?History  ?  ? ?CSN: [id number] ? ?Arrival date and time: 12/08/21 1236 ? ? Event Date/Time  ? First Provider Initiated Contact with Patient 12/08/21 1432   ?  ? ?No chief complaint on file. ? ?HPI ?Sandra Ritter is a 31 y.o. G3P1011 at 2852w6d by certain LMP who presents to MAU with chief complaint of vaginal spotting. This is a new problem, onset 3 days ago and increasing in amount today. Patient has not needed to don a pad. She is not bleeding through her underwear or other clothing. She is not experiencing dizziness, weakness or activity intolerance. ? ?Patient also reports lower abdominal cramping, onset coinciding with onset of heavier spotting today. Pain score is 4/10. She denies aggravating or alleviating factors. She denies dysuria, abnormal vaginal discharge, fever or recent illness. Most recent sexual intercourse two days ago.  ? ?[facility] ? ?OB History   ? ? Gravida  ?3  ? Para  ?1  ? Term  ?1  ? Preterm  ?   ? AB  ?1  ? Living  ?1  ?  ? ? SAB  ?   ? IAB  ?   ? Ectopic  ?   ? Multiple  ?   ? Live Births  ?   ?   ?  ? Obstetric Comments  ?G1 2020: SVD, no issues ?G2 may 2021: 9wk elective ab [facility]  ?  ? ?  ? ? ?Past Medical History:  ?Diagnosis Date  ? Chronic hepatitis C (HCC)   ? Sepsis (HCC)   ? Substance abuse (HCC)   ? ? ?Past Surgical History:  ?Procedure Laterality Date  ? BLADDER SURGERY    ? "bladder tack" age 89  ? DILATION AND EVACUATION N/A 01/02/2020  ? Procedure: Suction D&C;  Surgeon: [place] BingPickens, Charlie, MD;  [facility];  Service: Gynecology;  Laterality: N/A;  ? ? ?No family history on file. ? ?Social History  ? ?Tobacco Use  ? Smoking status: Every Day  ?  Packs/day: 0.25  ?  Types: Cigarettes  ? Smokeless tobacco: Never  ?Substance Use Topics  ? Drug use: Not Currently  ? ? ?Allergies: No Known Allergies ? ?Medications Prior to Admission  ?Medication Sig Dispense Refill Last Dose  ? buprenorphine (SUBUTEX) 2 MG SUBL SL  tablet Place 8 mg under the tongue 3 (three) times daily before meals.   12/08/2021  ? Prenatal Vit-Fe Fumarate-FA (PRENATAL MULTIVITAMIN) TABS tablet Take 1 tablet by mouth daily at 12 noon.   12/07/2021  ? ibuprofen (ADVIL) 600 MG tablet Take 1 tablet (600 mg total) by mouth every 6 (six) hours as needed. (Patient not taking: Reported on 01/20/2020) 60 tablet 0   ? iron polysaccharides (NIFEREX) 150 MG capsule Take 1 capsule (150 mg total) by mouth daily. (Patient not taking: Reported on 01/20/2020) 60 capsule 0   ? Levonorgestrel-Eth Estradiol (TWIRLA) 120-30 MCG/24HR PTWK Place 1 patch onto the skin once a week. For three weeks. Leave patch off during the 4th week to have a period 9 patch 3   ? nitroGLYCERIN (NITROSTAT) 0.4 MG SL tablet Place 0.4 mg under the tongue every 5 (five) minutes as needed for chest pain.  (Patient not taking: Reported on 01/20/2020)     ? SUBOXONE 8-2 MG FILM Place 0.5 Film under the tongue every 12 (twelve) hours.     ? ? ?Review of Systems  ?Gastrointestinal:  Positive  for abdominal pain.  ?Genitourinary:  Positive for vaginal bleeding.  ?All other systems reviewed and are negative. ?Physical Exam  ? ?Blood pressure 113/73, pulse (!) 106, temperature 98.6 ?F (37 ?C), temperature source Oral, resp. rate 16, last menstrual period 09/09/2021, SpO2 97 %, unknown if currently breastfeeding. ? ?Physical Exam ?Vitals and nursing note reviewed. Exam conducted with a chaperone present.  ?Constitutional:   ?   Appearance: Normal appearance.  ?Cardiovascular:  ?   Rate and Rhythm: Normal rate and regular rhythm.  ?   Pulses: Normal pulses.  ?   Heart sounds: Normal heart sounds.  ?Pulmonary:  ?   Effort: Pulmonary effort is normal.  ?   Breath sounds: Normal breath sounds.  ?Abdominal:  ?   General: Abdomen is flat.  ?   Tenderness: There is no abdominal tenderness. There is no right CVA tenderness or left CVA tenderness.  ?Genitourinary: ?   Comments: Deferred pending [place] results ?Skin: ?    Capillary Refill: Capillary refill takes less than 2 seconds.  ?Neurological:  ?   Mental Status: She is alert and oriented to person, place, and time.  ?Psychiatric:     ?   Mood and Affect: Mood normal.     ?   Behavior: Behavior normal.     ?   Thought Content: Thought content normal.     ?   Judgment: Judgment normal.  ? ? ?MAU Course  ?Procedures ? ?MDM ? ?--Clue cells without other Amsel's Criteria. Will not treat for Bacterial Vaginosis ?--Patient planning prenatal care with [facility] but has not yet established relationship with them. Verbalizes preference for viability scan at [facility] ? ?Orders Placed This Encounter  ?Procedures  ? Wet prep, genital  ? US OB LESS THAN 14 WEEKS WITH OB TRANSVAGINAL  ? US OB Transvaginal  ? Urinalysis, Routine w reflex microscopic Urine, Clean Catch  ? CBC  ? hCG, quantitative, pregnancy  ? Pregnancy, urine POC  ? Discharge patient  ? ?Patient Vitals for the past 24 hrs: ? BP Temp Temp src Pulse Resp SpO2  ?12/08/21 1447 109/74 -- -- 75 18 99 %  ?12/08/21 1313 111/78 -- -- 89 -- --  ?12/08/21 1255 113/73 98.6 ?F (37 ?C) Oral (!) 106 16 --  ?12/08/21 1254 -- -- -- -- 17 97 %  ? ?Results for orders placed or performed during the hospital encounter of 12/08/21 (from the past 24 hour(s))  ?Pregnancy, urine POC     Status: Abnormal  ? Collection Time: 12/08/21 12:47 PM  ?Result Value Ref Range  ? Preg Test, Ur POSITIVE (A) NEGATIVE  ?Urinalysis, Routine w reflex microscopic Urine, Clean Catch     Status: Abnormal  ? Collection Time: 12/08/21 12:57 PM  ?Result Value Ref Range  ? Color, Urine YELLOW YELLOW  ? APPearance CLOUDY (A) CLEAR  ? Specific Gravity, Urine 1.008 1.005 - 1.030  ? pH 6.0 5.0 - 8.0  ? Glucose, UA NEGATIVE NEGATIVE mg/dL  ? Hgb urine dipstick LARGE (A) NEGATIVE  ? Bilirubin Urine NEGATIVE NEGATIVE  ? Ketones, ur NEGATIVE NEGATIVE mg/dL  ? Protein, ur NEGATIVE NEGATIVE mg/dL  ? Nitrite NEGATIVE NEGATIVE  ? Leukocytes,Ua SMALL (A) NEGATIVE  ? RBC / HPF 6-10 0 - 5  RBC/hpf  ? WBC, UA 11-20 0 - 5 WBC/hpf  ? Bacteria, UA MANY (A) NONE SEEN  ? Squamous Epithelial / LPF 21-50 0 - 5  ? Amorphous Crystal PRESENT   ?CBC  Status: Abnormal  ? Collection Time: 12/08/21  1:08 PM  ?Result Value Ref Range  ? WBC 5.9 4.0 - 10.5 K/uL  ? RBC 4.80 3.87 - 5.11 MIL/uL  ? Hemoglobin 12.9 12.0 - 15.0 g/dL  ? HCT 38.0 36.0 - 46.0 %  ? MCV 79.2 (L) 80.0 - 100.0 fL  ? MCH 26.9 26.0 - 34.0 pg  ? MCHC 33.9 30.0 - 36.0 g/dL  ? RDW 11.9 11.5 - 15.5 %  ? Platelets 272 150 - 400 K/uL  ? nRBC 0.0 0.0 - 0.2 %  ?hCG, quantitative, pregnancy     Status: Abnormal  ? Collection Time: 12/08/21  1:08 PM  ?Result Value Ref Range  ? hCG, Beta Chain, Quant, S 12,431 (H) <5 mIU/mL  ?Wet prep, genital     Status: Abnormal  ? Collection Time: 12/08/21  1:28 PM  ? Specimen: Vaginal  ?Result Value Ref Range  ? Yeast Wet Prep HPF POC NONE SEEN NONE SEEN  ? Trich, Wet Prep NONE SEEN NONE SEEN  ? Clue Cells Wet Prep HPF POC PRESENT (A) NONE SEEN  ? WBC, Wet Prep HPF POC >=10 (A) <10  ? Sperm NONE SEEN   ? ?US OB LESS THAN 14 WEEKS WITH OB TRANSVAGINAL ? ?Result Date: 12/08/2021 ?CLINICAL DATA:  Vaginal bleeding with positive pregnancy test. EXAM: OBSTETRIC <14 WK [place] AND TRANSVAGINAL OB US TECHNIQUE: Both transabdominal and transvaginal ultrasound examinations were performed for complete evaluation of the gestation as well as the maternal uterus, adnexal regions, and pelvic cul-de-sac. Transvaginal technique was performed to assess early pregnancy. COMPARISON:  None Available. FINDINGS: Intrauterine gestational sac: Single. The sac is irregular and elongated with internal septation or potentially visualized amnion. Yolk sac:  Not definitely visualized Embryo:  Possible tiny embryo at 3 mm crown-rump length. Cardiac Activity: Nonvisualized Heart Rate: N/A  bpm MSD: 29 mm   7 w   6 d CRL:  2.6 mm   5 w   5 d Subchorionic hemorrhage:  None visualized Maternal uterus/adnexae: Maternal ovaries unremarkable. IMPRESSION: Elongated  irregular gestational sac with potential tiny fetal pole but no yolk sac visible. Assuming visible fetal pole with no demonstrable heartbeat, study is considered suspicious for non-viability. The enlarged irreg

## 2021-12-09 ENCOUNTER — Telehealth: Payer: Self-pay | Admitting: Family Medicine

## 2021-12-09 LAB — GC/CHLAMYDIA PROBE AMP (~~LOC~~) NOT AT ARMC
Chlamydia: NEGATIVE
Comment: NEGATIVE
Comment: NORMAL
Neisseria Gonorrhea: NEGATIVE

## 2021-12-09 NOTE — Telephone Encounter (Signed)
Called patient to schedule a viability ultrasound, there was no answer to the phone call so a voicemail was left with the call back number for the office.  ?

## 2021-12-24 ENCOUNTER — Ambulatory Visit (INDEPENDENT_AMBULATORY_CARE_PROVIDER_SITE_OTHER): Payer: No Typology Code available for payment source | Admitting: Student

## 2021-12-24 ENCOUNTER — Encounter: Payer: Self-pay | Admitting: Student

## 2021-12-24 ENCOUNTER — Ambulatory Visit (INDEPENDENT_AMBULATORY_CARE_PROVIDER_SITE_OTHER): Payer: No Typology Code available for payment source

## 2021-12-24 ENCOUNTER — Encounter: Payer: Self-pay | Admitting: Family Medicine

## 2021-12-24 DIAGNOSIS — O021 Missed abortion: Secondary | ICD-10-CM

## 2021-12-24 DIAGNOSIS — Z3A15 15 weeks gestation of pregnancy: Secondary | ICD-10-CM

## 2021-12-24 DIAGNOSIS — O3680X0 Pregnancy with inconclusive fetal viability, not applicable or unspecified: Secondary | ICD-10-CM

## 2021-12-24 MED ORDER — MISOPROSTOL 200 MCG PO TABS: 800.0000 ug | ORAL_TABLET | ORAL | 1 refills | Status: DC | PRN

## 2021-12-24 MED ORDER — ONDANSETRON 4 MG PO TBDP: 4.0000 mg | ORAL_TABLET | Freq: Three times a day (TID) | ORAL | 0 refills | Status: DC | PRN

## 2021-12-24 NOTE — Patient Instructions (Signed)
Cytotec for Pregnancy Failure FACTS YOU SHOULD KNOW  WHAT IS AN EARLY PREGNANCY FAILURE? Once the egg is fertilized with the sperm and begins to develop, it attaches to the lining of the uterus. This early pregnancy tissue may not develop into an embryo (the beginning stage of a baby). Sometimes an embryo does develop but does not continue to grow. These problems can be seen on ultrasound.   MANAGEMNT OF EARLY PREGNANCY FAILURE: About 4 out of 100 (0.25%) women will have a pregnancy loss in her lifetime.  One in five pregnancies is found to be an early pregnancy failure.  There are 3 ways to care for an early pregnancy failure:   Surgery, (2) Medicine, (3) Waiting for you to pass the pregnancy on your own. The decision as to how to proceed after being diagnosed with and early pregnancy failure is an individual one.  The decision can be made only after appropriate counseling.  You need to weigh the pros and cons of the 3 choices. Then you can make the choice that works for you. SURGERY (D&E) Procedure over in 1 day Requires being put to sleep Bleeding may be light Possible problems during surgery, including injury to womb(uterus) Care provider has more control Medicine (CYTOTEC) The complete procedure may take days to weeks No Surgery Bleeding may be heavy at times There may be drug side effects Patient has more control Waiting You may choose to wait, in which case your own body may complete the passing of the abnormal early pregnancy on its own in about 2-4 weeks Your bleeding may be heavy at times There is a small possibility that you may need surgery if the bleeding is too much or not all of the pregnancy has passed. CYTOTEC MANAGEMENT Prostaglandins (cytotec) are the most widely used drug for this purpose. They cause the uterus to cramp and contract. You will place the medicine yourself inside your vagina in the privacy of your home. Empting of the uterus should occur within 3 days but  the process may continue for several weeks. The bleeding may seem heavy at times. POSSIBLE SIDE EFFECTS FROM CYTOTEC Nausea   Vomiting Diarrhea Fever Chills  Hot Flashes Side effects  from the process of the early pregnancy failure include: Cramping  Bleeding Headaches  Dizziness RISKS: This is a low risk procedure. Less than 1 in 100 women has a complication. An incomplete passage of the early pregnancy may occur. Also, Hemorrhage (heavy bleeding) could happen.  Rarely the pregnancy will not be passed completely. Excessively heavy bleeding may occur.  Your doctor may need to perform surgery to empty the uterus (D&E). Afterwards: Everybody will feel differently after the early pregnancy completion. You may have soreness or cramps for a day or two. You may have soreness or cramps for day or two.  You may have light bleeding for up to 2 weeks. You may be as active as you feel like being. If you have any of the following problems you may call Maternity Admissions Unit at [phone]. If you have pain that does not get better  with pain medication Bleeding that soaks through 2 thick full-sized sanitary pads in an hour Cramps that last longer than 2 days Foul smelling discharge Fever above 100.4 degrees F Even if you do not have any of these symptoms, you should have a follow-up exam to make sure you are healing properly. This appointment will be made for you before you leave the hospital. Your next normal period will  start again in 4-6 week after the loss. You can get pregnant soon after the loss, so use birth control right away. Finally: Make sure all your questions are answered before during and after any procedure. Follow up with medical care and family planning methods.     

## 2021-12-24 NOTE — Progress Notes (Signed)
GYNECOLOGY OFFICE VISIT NOTE  History:   Sandra Ritter is a 31 y.o. O1B5102 here today for ultrasound results.  Ultrasound shows empty IUGS.  Abdominal pain: No   Vaginal bleeding:  None currently. Had heavy bleeding after MAU visit 2 weeks ago.      Health Maintenance Due  Topic Date Due   COVID-19 Vaccine (1) Never done   HIV Screening  Never done   Hepatitis C Screening  Never done    Past Medical History:  Diagnosis Date   Chronic hepatitis C (HCC)    Sepsis (HCC)    Substance abuse (HCC)     Past Surgical History:  Procedure Laterality Date   BLADDER SURGERY     "bladder tack" age 55   DILATION AND EVACUATION N/A 01/02/2020   Procedure: Suction D&C;  Surgeon: [place] Bing, MD;  Location: [facility] OR;  Service: Gynecology;  Laterality: N/A;    The following portions of the patient's history were reviewed and updated as appropriate: allergies, current medications, past family history, past medical history, past social history, past surgical history and problem list.   Health Maintenance:   Last pap: Lab Results  Component Value Date   DIAGPAP  01/20/2020    - Negative for intraepithelial lesion or malignancy (NILM)    Review of Systems:  Pertinent items noted in HPI and remainder of comprehensive ROS otherwise negative.  Physical Exam:  LMP 09/09/2021  CONSTITUTIONAL: Well-developed, well-nourished female in no acute distress.  HEENT:  Normocephalic, atraumatic. External right and left ear normal. No scleral icterus.  NECK: Normal range of motion, supple, no masses noted on observation SKIN: No rash noted. Not diaphoretic. No erythema. No pallor. MUSCULOSKELETAL: Normal range of motion. No edema noted. NEUROLOGIC: Alert and oriented to person, place, and time. Normal muscle tone coordination.  PSYCHIATRIC: Normal mood and affect. Normal behavior. Normal judgment and thought content. RESPIRATORY: Effort normal, no problems with respiration noted  Labs and  Imaging No results found for this or any previous visit (from the past 168 hour(s)). US OB LESS THAN 14 WEEKS WITH OB TRANSVAGINAL  Result Date: 12/08/2021 CLINICAL DATA:  Vaginal bleeding with positive pregnancy test. EXAM: OBSTETRIC <14 WK [place] AND TRANSVAGINAL OB US TECHNIQUE: Both transabdominal and transvaginal ultrasound examinations were performed for complete evaluation of the gestation as well as the maternal uterus, adnexal regions, and pelvic cul-de-sac. Transvaginal technique was performed to assess early pregnancy. COMPARISON:  None Available. FINDINGS: Intrauterine gestational sac: Single. The sac is irregular and elongated with internal septation or potentially visualized amnion. Yolk sac:  Not definitely visualized Embryo:  Possible tiny embryo at 3 mm crown-rump length. Cardiac Activity: Nonvisualized Heart Rate: N/A  bpm MSD: 29 mm   7 w   6 d CRL:  2.6 mm   5 w   5 d Subchorionic hemorrhage:  None visualized Maternal uterus/adnexae: Maternal ovaries unremarkable. IMPRESSION: Elongated irregular gestational sac with potential tiny fetal pole but no yolk sac visible. Assuming visible fetal pole with no demonstrable heartbeat, study is considered suspicious for non-viability. The enlarged irregular gestational sac would also support non-viability. Findings are suspicious for failed pregnancy. Electronically Signed   By: Kennith Center M.D.   On: 12/08/2021 14:28      Assessment and Plan:  Nonviable Pregnancy - O02.89  Ultrasound shows empty, irregularly shaped gestational sac measuring 4 cm. She is 15+ weeks by sure LMP with a  positive pregnancy test back in March. Ultrasound 2 weeks ago showed IUGS  with possible tiny embryo. Meets criteria for failed pregnancy.    Discussed management of missed abortion: expectant management vs misoprostol vs D&E.  Risks and benefits of all modalities discussed; all questions answered.  Patient opted for misoprostol administration.  Risks and benefits of  medical management were carefully explained, including a success rate of 80-90%, the need for another person to be at home with her, and to call/come in if she had heavy bleeding, dizziness, or severe pain not relieved by medication.  Verbal consent was obtained.  Misoprostol 800 mcg with 1 refill & zofran were prescribed (patient goes to pain clinic so no narcotics were prescribed); written instructions given to patient. She will follow up in one week; if there has been no passage of pregnancy, will consider D&E.  Bleeding precautions reviewed; she was told to call clinic or come to MAU for any concerns.   Please refer to After Visit Summary for other counseling recommendations.   Return in about 1 week (around 12/31/2021) for miscarriage follow up with provider.    Total face-to-face time with patient: 15 minutes.  Over 50% of encounter was spent on counseling and coordination of care.   Judeth Horn, NP [facility] [employer], [facility]

## 2021-12-30 ENCOUNTER — Ambulatory Visit (INDEPENDENT_AMBULATORY_CARE_PROVIDER_SITE_OTHER): Payer: No Typology Code available for payment source | Admitting: Obstetrics and Gynecology

## 2021-12-30 ENCOUNTER — Encounter: Payer: Self-pay | Admitting: Obstetrics and Gynecology

## 2021-12-30 VITALS — BP 127/86 | HR 91 | Ht 65.0 in | Wt 168.0 lb

## 2021-12-30 DIAGNOSIS — O039 Complete or unspecified spontaneous abortion without complication: Secondary | ICD-10-CM | POA: Diagnosis not present

## 2021-12-30 DIAGNOSIS — O034 Incomplete spontaneous abortion without complication: Secondary | ICD-10-CM | POA: Diagnosis not present

## 2021-12-30 DIAGNOSIS — Z5189 Encounter for other specified aftercare: Secondary | ICD-10-CM

## 2021-12-30 NOTE — Progress Notes (Signed)
Obstetrics and Gynecology Visit Return Patient Evaluation  Appointment Date: 12/30/2021  Primary Care Provider: Einar Ritter Clinic: Center for [facility]  Chief Complaint: follow up medical management of missed AB  History of Present Illness:  Sandra Ritter is a 31 y.o. [phone] with above CC. Patient seen in the MAU for bleeding in early pregnancy and ?early embyro seen but irregular sac and plan for repeat in 10 days. She had an u/s on 5/30 and was diagnosed with missed AB. Provider given options to pt and she elected for medications. She states she took 400 cytotec later that day and had increased bleeding and since then she has had about two pads per day VB with some cramping; no fevers, chills   Review of Systems: as noted in the History of Present Illness.  Patient Active Problem List   Diagnosis Date Noted   Chlamydia 01/23/2020   Retained products of conception following abortion 01/02/2020   Medications:  Sandra Ritter had no medications administered during this visit. Current Outpatient Medications  Medication Sig Dispense Refill   buprenorphine (SUBUTEX) 2 MG SUBL SL tablet Place 8 mg under the tongue 3 (three) times daily before meals.     misoprostol (CYTOTEC) 200 MCG tablet Take 4 tablets (800 mcg total) by mouth every other day as needed for up to 2 doses. 4 tablet 1   Multiple Vitamin (MULTIVITAMIN) tablet Take 1 tablet by mouth daily.     SUBOXONE 8-2 MG FILM Place 0.5 Film under the tongue every 12 (twelve) hours.     ondansetron (ZOFRAN-ODT) 4 MG disintegrating tablet Take 1 tablet (4 mg total) by mouth every 8 (eight) hours as needed for nausea or vomiting. (Patient not taking: Reported on 12/30/2021) 15 tablet 0   Prenatal Vit-Fe Fumarate-FA (PRENATAL MULTIVITAMIN) TABS tablet Take 1 tablet by mouth daily at 12 noon. (Patient not taking: Reported on 12/30/2021)     No current facility-administered medications for this visit.     Allergies: has No Known Allergies.  Physical Exam:  BP 127/86   Pulse 91   Ht 5\' 5"  (1.651 m)   Wt 168 lb (76.2 kg)   LMP 09/09/2021 Comment: SAB  Breastfeeding Unknown   BMI 27.96 kg/m  Body mass index is 27.96 kg/m. General appearance: Well nourished, well developed female in no acute distress.  Abdomen: diffusely non tender to palpation, non distended, and no masses, hernias Neuro/Psych:  Normal mood and affect.    Ipad u/s unclear if retained POCs so transvag u/s done with chaperone present which showed 53mm sac with ?debris inside, retroverted uterus. Overall uterine size 7.5cm x 5.4cm. no free fluid in the pelvis  Assessment: pt stable with retained products of conception  Plan:  1. Follow-up visit after miscarriage D/w her and she is amenable to suction d&c. Request sent.    RTC: post op  31m MD Attending Center for [facility])

## 2021-12-30 NOTE — Progress Notes (Signed)
Pt reports minor cramping with some bleeding.

## 2021-12-31 ENCOUNTER — Encounter (HOSPITAL_BASED_OUTPATIENT_CLINIC_OR_DEPARTMENT_OTHER): Payer: Self-pay | Admitting: Obstetrics and Gynecology

## 2021-12-31 ENCOUNTER — Telehealth: Payer: Self-pay

## 2021-12-31 DIAGNOSIS — Z973 Presence of spectacles and contact lenses: Secondary | ICD-10-CM

## 2021-12-31 HISTORY — DX: Presence of spectacles and contact lenses: Z97.3

## 2021-12-31 NOTE — Progress Notes (Addendum)
Spoke w/ via phone for pre-op interview---pt Lab needs dos----cbc, bmet  , ekg           Lab results------see below COVID test -----patient states asymptomatic no test needed Arrive at -------830 am 01-01-2022 NPO after MN NO Solid Food.  Clear liquids from MN until---730 am Med rec completed Medications to take morning of surgery -----subutex Diabetic medication -----n/a Patient instructed no nail polish to be worn day of surgery Patient instructed to bring photo id and insurance card day of surgery Patient aware to have Driver (ride ) / caregiver    mother angela for 24 hours after surgery  Patient Special Instructions -----no smoking for 24 hours before surgery Pre-Op special Istructions ----- Patient verbalized understanding of instructions that were given at this phone interview. Patient denies shortness of breath, chest pain, fever, cough at this phone interview.   Anesthesia review: history of acute kidney failure 2018 ( kidney issues resolved per patient on 12-31-2021), hx of coronary vasospam, worked up by cardiology  on 10-21-2019 note on chart/care everywhere,  last issues was 3 months ago per pt on 12-31-2021, history of chronic hepatitis C, took treatment for hepatitis  C  Reviewed patient history with dr c bass mda and pt ok for 01-01-2022 surgery at [facility] per dr c bass, mda ( get ekg dos per dr c bass mda, order entered in epic for ekg dos)  Pt has right cheek dermal piercing that cannot  be removed

## 2021-12-31 NOTE — Anesthesia Preprocedure Evaluation (Signed)
Anesthesia Evaluation  Patient identified by MRN, date of birth, ID band Patient awake    Reviewed: Allergy & Precautions, NPO status , Patient's Chart, lab work & pertinent test results  History of Anesthesia Complications (+) history of anesthetic complications  Airway Mallampati: II  TM Distance: >3 FB Neck ROM: Full    Dental  (+) Dental Advisory Given, Partial Upper   Pulmonary Current Smoker,    Pulmonary exam normal        Cardiovascular negative cardio ROS Normal cardiovascular exam     Neuro/Psych negative neurological ROS     GI/Hepatic negative GI ROS, (+) Hepatitis -, CTreated   Endo/Other  negative endocrine ROS  Renal/GU negative Renal ROS     Musculoskeletal negative musculoskeletal ROS (+)   Abdominal   Peds  Hematology negative hematology ROS (+)   Anesthesia Other Findings   Reproductive/Obstetrics                            Anesthesia Physical Anesthesia Plan  ASA: 2  Anesthesia Plan: General   Post-op Pain Management: Celebrex PO (pre-op)* and Tylenol PO (pre-op)*   Induction: Intravenous  PONV Risk Score and Plan: 3 and Ondansetron, Dexamethasone, Midazolam and Scopolamine patch - Pre-op  Airway Management Planned: LMA  Additional Equipment:   Intra-op Plan:   Post-operative Plan: Extubation in OR  Informed Consent: I have reviewed the patients History and Physical, chart, labs and discussed the procedure including the risks, benefits and alternatives for the proposed anesthesia with the patient or authorized representative who has indicated his/her understanding and acceptance.     Dental advisory given  Plan Discussed with: Anesthesiologist and CRNA  Anesthesia Plan Comments:        Anesthesia Quick Evaluation

## 2021-12-31 NOTE — Progress Notes (Signed)
Spoke w/ via phone for pre-op interview---pt Lab needs dos----cbc, bmet, ekg           Lab results------see below COVID test -----patient states asymptomatic no test needed Arrive at -------830 am 01-01-2022 NPO after MN NO Solid Food.  Clear liquids from MN until---730 am Med rec completed Medications to take morning of surgery -----subutex Diabetic medication -----n/a Patient instructed no nail polish to be worn day of surgery Patient instructed to bring photo id and insurance card day of surgery Patient aware to have Driver (ride ) / caregiver    mother angela for 24 hours after surgery  Patient Special Instructions -----no smoking for 24 hours before surgery Pre-Op special Istructions ----- Patient verbalized understanding of instructions that were given at this phone interview. Patient denies shortness of breath, chest pain, fever, cough at this phone interview.   Anesthesia review: history of acute kidney failure 2018 ( kidney issues resolved per patient on 12-31-2021), hx of coronary vasospam, last issues was 3 months ago per pt on 12-31-2021, history of chronic hepatitis C, took treatment for hepatitis  C. Reviewed patient medical history with dr c bass, mda and pt is ok for surgery 01-01-2022 at [facility] per dr c bass.

## 2021-12-31 NOTE — H&P (Signed)
Sandra Ritter is an 31 y.o. female with retained POC's following medical treatment for MAB Surgerical management recommended to pt  Blood type O positive  Menstrual History: Menarche age: 78 Patient's last menstrual period was 09/09/2021.    Past Medical History:  Diagnosis Date   Chlamydia 01/23/2020   History of Chronic hepatitis C ([place])    pt took tx for with [employer] july to august 2021   Sepsis [facility])    Substance abuse ([place])     Past Surgical History:  Procedure Laterality Date   BLADDER SURGERY     "bladder tack" age 75   DILATION AND EVACUATION N/A 01/02/2020   Procedure: Suction D&C;  Surgeon: Aletha Halim, MD;  Location: [place];  Service: Gynecology;  Laterality: N/A;    No family history on file.  Social History:  reports that she has been smoking cigarettes. She has been smoking an average of .25 packs per day. She has never used smokeless tobacco. She reports that she does not currently use drugs. No history on file for alcohol use.  Allergies: No Known Allergies  No medications prior to admission.    Review of Systems  Constitutional: Negative.   Respiratory: Negative.    Cardiovascular: Negative.   Gastrointestinal: Negative.   Genitourinary:  Positive for vaginal bleeding.   Last menstrual period 09/09/2021, unknown if currently breastfeeding. Physical Exam Constitutional:      Appearance: Normal appearance.  Cardiovascular:     Rate and Rhythm: Normal rate and regular rhythm.  Pulmonary:     Effort: Pulmonary effort is normal.     Breath sounds: Normal breath sounds.  Abdominal:     General: Bowel sounds are normal.     Palpations: Abdomen is soft.  Genitourinary:    Comments: Deferred to OR Neurological:     Mental Status: She is alert.    No results found for this or any previous visit (from the past 24 hour(s)).  No results found.  Assessment/Plan: Retained POC following MAB   Suction D & C recommended to pt. R/B/Post op care  reviewed. Pt has verbalized understanding and desires to proceed.    Chancy Milroy 12/31/2021, 9:46 AM

## 2021-12-31 NOTE — Telephone Encounter (Signed)
Called patient, no answer, left voicemail with surgery date, time, location and preop instructions. 

## 2021-12-31 NOTE — Progress Notes (Signed)
Left voicemail to inform of time change for arrival to 0730. Asked to not have anything to eat or drink after midnight. Asked to call [phone] to confirm receipt of message.

## 2022-01-01 ENCOUNTER — Other Ambulatory Visit: Payer: Self-pay

## 2022-01-01 ENCOUNTER — Ambulatory Visit (HOSPITAL_BASED_OUTPATIENT_CLINIC_OR_DEPARTMENT_OTHER): Payer: No Typology Code available for payment source | Admitting: Anesthesiology

## 2022-01-01 ENCOUNTER — Ambulatory Visit (HOSPITAL_BASED_OUTPATIENT_CLINIC_OR_DEPARTMENT_OTHER)
Admission: RE | Admit: 2022-01-01 | Discharge: 2022-01-01 | Disposition: A | Discharge status: Home / Self Care | Payer: No Typology Code available for payment source | Source: Ambulatory Visit | Attending: Obstetrics and Gynecology | Admitting: Obstetrics and Gynecology

## 2022-01-01 ENCOUNTER — Encounter (HOSPITAL_BASED_OUTPATIENT_CLINIC_OR_DEPARTMENT_OTHER)
Admission: RE | Disposition: A | Discharge status: Home / Self Care | Payer: Self-pay | Source: Ambulatory Visit | Attending: Obstetrics and Gynecology

## 2022-01-01 ENCOUNTER — Encounter (HOSPITAL_BASED_OUTPATIENT_CLINIC_OR_DEPARTMENT_OTHER): Payer: Self-pay | Admitting: Obstetrics and Gynecology

## 2022-01-01 DIAGNOSIS — F17210 Nicotine dependence, cigarettes, uncomplicated: Secondary | ICD-10-CM | POA: Insufficient documentation

## 2022-01-01 DIAGNOSIS — I201 Angina pectoris with documented spasm: Secondary | ICD-10-CM

## 2022-01-01 DIAGNOSIS — Z8619 Personal history of other infectious and parasitic diseases: Secondary | ICD-10-CM | POA: Insufficient documentation

## 2022-01-01 DIAGNOSIS — O021 Missed abortion: Secondary | ICD-10-CM | POA: Insufficient documentation

## 2022-01-01 DIAGNOSIS — O034 Incomplete spontaneous abortion without complication: Secondary | ICD-10-CM | POA: Diagnosis not present

## 2022-01-01 DIAGNOSIS — Z01818 Encounter for other preprocedural examination: Secondary | ICD-10-CM

## 2022-01-01 HISTORY — DX: Personal history of other diseases of the circulatory system: Z86.79

## 2022-01-01 HISTORY — PX: DILATION AND EVACUATION: SHX1459

## 2022-01-01 LAB — POCT I-STAT, CHEM 8
BUN: 10 mg/dL (ref 6–20)
Calcium, Ion: 1.16 mmol/L (ref 1.15–1.40)
Chloride: 102 mmol/L (ref 98–111)
Creatinine, Ser: 0.7 mg/dL (ref 0.44–1.00)
Glucose, Bld: 97 mg/dL (ref 70–99)
HCT: 39 % (ref 36.0–46.0)
Hemoglobin: 13.3 g/dL (ref 12.0–15.0)
Potassium: 6.7 mmol/L (ref 3.5–5.1)
Sodium: 137 mmol/L (ref 135–145)
TCO2: 29 mmol/L (ref 22–32)

## 2022-01-01 SURGERY — DILATION AND EVACUATION, UTERUS
Anesthesia: General | Site: Vagina

## 2022-01-01 MED ORDER — DOXYCYCLINE HYCLATE 100 MG IV SOLR
200.0000 mg | INTRAVENOUS | Status: AC
  Administered 2022-01-01: 200 mg via INTRAVENOUS
  Filled 2022-01-01: qty 200

## 2022-01-01 MED ORDER — OXYCODONE HCL 5 MG PO TABS: 5.0000 mg | ORAL_TABLET | Freq: Four times a day (QID) | ORAL | 0 refills | Status: DC | PRN

## 2022-01-01 MED ORDER — ONDANSETRON HCL 4 MG/2ML IJ SOLN
INTRAMUSCULAR | Status: DC | PRN
  Administered 2022-01-01: 4 mg via INTRAVENOUS

## 2022-01-01 MED ORDER — LACTATED RINGERS IV SOLN: INTRAVENOUS | Status: DC

## 2022-01-01 MED ORDER — MIDAZOLAM HCL 2 MG/2ML IJ SOLN
INTRAMUSCULAR | Status: AC
  Filled 2022-01-01: qty 2

## 2022-01-01 MED ORDER — SCOPOLAMINE 1 MG/3DAYS TD PT72SCOPOLAMINE 1 MG/3DAYS
1.0000 | MEDICATED_PATCH | TRANSDERMAL | Status: DC
Start: 2022-01-01 — End: 2022-01-01
  Administered 2022-01-01: 1.5 mg via TRANSDERMAL

## 2022-01-01 MED ORDER — SOD CITRATE-CITRIC ACID 500-334 MG/5ML PO SOLN: 30.0000 mL | ORAL | Status: DC

## 2022-01-01 MED ORDER — PROPOFOL 10 MG/ML IV BOLUS
INTRAVENOUS | Status: AC
  Filled 2022-01-01: qty 20

## 2022-01-01 MED ORDER — CELECOXIB 200 MG PO CAPS
ORAL_CAPSULE | ORAL | Status: AC
  Filled 2022-01-01: qty 1

## 2022-01-01 MED ORDER — FENTANYL CITRATE (PF) 100 MCG/2ML IJ SOLN
INTRAMUSCULAR | Status: AC
  Filled 2022-01-01: qty 2

## 2022-01-01 MED ORDER — PROMETHAZINE HCL 25 MG/ML IJ SOLN: 6.2500 mg | INTRAMUSCULAR | Status: DC | PRN

## 2022-01-01 MED ORDER — AMISULPRIDE (ANTIEMETIC) 5 MG/2ML IV SOLN: 10.0000 mg | Freq: Once | INTRAVENOUS | Status: DC | PRN

## 2022-01-01 MED ORDER — PROPOFOL 10 MG/ML IV BOLUS
INTRAVENOUS | Status: DC | PRN
  Administered 2022-01-01: 160 mg via INTRAVENOUS

## 2022-01-01 MED ORDER — ACETAMINOPHEN 500 MG PO TABS: 1000.0000 mg | ORAL_TABLET | Freq: Once | ORAL | Status: DC

## 2022-01-01 MED ORDER — MISOPROSTOL 200 MCG PO TABS
ORAL_TABLET | ORAL | Status: DC | PRN
  Administered 2022-01-01: 200 ug via RECTAL

## 2022-01-01 MED ORDER — IBUPROFEN 800 MG PO TABS: 800.0000 mg | ORAL_TABLET | Freq: Three times a day (TID) | ORAL | 0 refills | Status: DC | PRN

## 2022-01-01 MED ORDER — DEXAMETHASONE SODIUM PHOSPHATE 10 MG/ML IJ SOLN
INTRAMUSCULAR | Status: AC
  Filled 2022-01-01: qty 1

## 2022-01-01 MED ORDER — 0.9 % SODIUM CHLORIDE (POUR BTL) OPTIME
TOPICAL | Status: DC | PRN
  Administered 2022-01-01: 500 mL

## 2022-01-01 MED ORDER — LIDOCAINE 2% (20 MG/ML) 5 ML SYRINGE
INTRAMUSCULAR | Status: DC | PRN
  Administered 2022-01-01: 60 mg via INTRAVENOUS

## 2022-01-01 MED ORDER — SCOPOLAMINE 1 MG/3DAYS TD PT72
MEDICATED_PATCH | TRANSDERMAL | Status: AC
  Filled 2022-01-01: qty 1

## 2022-01-01 MED ORDER — POVIDONE-IODINE 10 % EX SWAB: 2.0000 "application " | Freq: Once | CUTANEOUS | Status: DC

## 2022-01-01 MED ORDER — ACETAMINOPHEN 500 MG PO TABS
ORAL_TABLET | ORAL | Status: AC
  Filled 2022-01-01: qty 2

## 2022-01-01 MED ORDER — KETOROLAC TROMETHAMINE 15 MG/ML IJ SOLN
15.0000 mg | INTRAMUSCULAR | Status: AC
  Administered 2022-01-01: 15 mg via INTRAVENOUS

## 2022-01-01 MED ORDER — CELECOXIB 200 MG PO CAPS
200.0000 mg | ORAL_CAPSULE | Freq: Once | ORAL | Status: AC
  Administered 2022-01-01: 200 mg via ORAL

## 2022-01-01 MED ORDER — FENTANYL CITRATE (PF) 100 MCG/2ML IJ SOLN
INTRAMUSCULAR | Status: DC | PRN
  Administered 2022-01-01 (×2): 50 ug via INTRAVENOUS

## 2022-01-01 MED ORDER — ACETAMINOPHEN 500 MG PO TABS
1000.0000 mg | ORAL_TABLET | ORAL | Status: AC
  Administered 2022-01-01: 1000 mg via ORAL

## 2022-01-01 MED ORDER — KETOROLAC TROMETHAMINE 30 MG/ML IJ SOLN
INTRAMUSCULAR | Status: AC
  Filled 2022-01-01: qty 1

## 2022-01-01 MED ORDER — FENTANYL CITRATE (PF) 100 MCG/2ML IJ SOLN
25.0000 ug | INTRAMUSCULAR | Status: DC | PRN
  Administered 2022-01-01 (×2): 25 ug via INTRAVENOUS

## 2022-01-01 MED ORDER — DEXAMETHASONE SODIUM PHOSPHATE 10 MG/ML IJ SOLN
INTRAMUSCULAR | Status: DC | PRN
  Administered 2022-01-01: 5 mg via INTRAVENOUS

## 2022-01-01 MED ORDER — LIDOCAINE HCL (PF) 2 % IJ SOLN
INTRAMUSCULAR | Status: AC
  Filled 2022-01-01: qty 5

## 2022-01-01 MED ORDER — MIDAZOLAM HCL 2 MG/2ML IJ SOLN
INTRAMUSCULAR | Status: DC | PRN
  Administered 2022-01-01: 2 mg via INTRAVENOUS

## 2022-01-01 MED ORDER — ONDANSETRON HCL 4 MG/2ML IJ SOLN
INTRAMUSCULAR | Status: AC
  Filled 2022-01-01: qty 2

## 2022-01-01 MED ORDER — FERRIC SUBSULFATE 259 MG/GM EX SOLN
CUTANEOUS | Status: DC | PRN
  Administered 2022-01-01: 1

## 2022-01-01 SURGICAL SUPPLY — 23 items
CATH ROBINSON RED A/P 16FR (CATHETERS) ×1 IMPLANT
DECANTER SPIKE VIAL GLASS SM (MISCELLANEOUS) ×1 IMPLANT
DRSG TELFA 3X8 NADH (GAUZE/BANDAGES/DRESSINGS) IMPLANT
GAUZE 4X4 16PLY ~~LOC~~+RFID DBL (SPONGE) ×2 IMPLANT
GLOVE BIO SURGEON STRL SZ7.5 (GLOVE) ×2 IMPLANT
GLOVE BIOGEL PI IND STRL 7.0 (GLOVE) ×1 IMPLANT
GLOVE BIOGEL PI INDICATOR 7.0 (GLOVE) ×1
GOWN STRL REUS W/TWL LRG LVL3 (GOWN DISPOSABLE) ×4 IMPLANT
HIBICLENS CHG 4% 4OZ BTL (MISCELLANEOUS) ×1 IMPLANT
KIT BERKELEY 1ST TRIMESTER 3/8 (MISCELLANEOUS) ×4 IMPLANT
KIT TURNOVER CYSTO (KITS) ×2 IMPLANT
NS IRRIG 1000ML POUR BTL (IV SOLUTION) ×1 IMPLANT
NS IRRIG 500ML POUR BTL (IV SOLUTION) ×1 IMPLANT
PACK VAGINAL MINOR WOMEN LF (CUSTOM PROCEDURE TRAY) ×2 IMPLANT
PAD DRESSING TELFA 3X8 NADH (GAUZE/BANDAGES/DRESSINGS) ×1 IMPLANT
PAD OB MATERNITY 4.3X12.25 (PERSONAL CARE ITEMS) ×2 IMPLANT
PAD PREP 24X48 CUFFED NSTRL (MISCELLANEOUS) ×2 IMPLANT
SET BERKELEY SUCTION TUBING (SUCTIONS) ×2 IMPLANT
TOWEL OR 17X26 10 PK STRL BLUE (TOWEL DISPOSABLE) ×3 IMPLANT
VACURETTE 10 RIGID CVD (CANNULA) IMPLANT
VACURETTE 7MM CVD STRL WRAP (CANNULA) IMPLANT
VACURETTE 8 RIGID CVD (CANNULA) ×1 IMPLANT
VACURETTE 9 RIGID CVD (CANNULA) IMPLANT

## 2022-01-01 NOTE — Anesthesia Postprocedure Evaluation (Signed)
Anesthesia Post Note  Patient: Sandra Ritter  Procedure(s) Performed: DILATATION AND EVACUATION (Vagina )     Patient location during evaluation: PACU Anesthesia Type: General Level of consciousness: sedated Pain management: pain level controlled Vital Signs Assessment: post-procedure vital signs reviewed and stable Respiratory status: spontaneous breathing and respiratory function stable Cardiovascular status: stable Postop Assessment: no apparent nausea or vomiting Anesthetic complications: no   No notable events documented.  Last Vitals:  Vitals:   01/01/22 1100 01/01/22 1115  BP: 116/75 110/73  Pulse: (!) 56 (!) 58  Resp: 12 11  Temp: (!) 36.3 C   SpO2: 98% 96%    Last Pain:  Vitals:   01/01/22 1115  TempSrc:   PainSc: 3                  Reannon Candella DANIEL

## 2022-01-01 NOTE — Transfer of Care (Signed)
Immediate Anesthesia Transfer of Care Note  Patient: Sandra Ritter  Procedure(s) Performed: Procedure(s) (LRB): DILATATION AND EVACUATION (N/A)  Patient Location: PACU  Anesthesia Type: General  Level of Consciousness: awake, oriented, sedated and patient cooperative  Airway & Oxygen Therapy: Patient Spontanous Breathing   Post-op Assessment: Report given to PACU RN and Post -op Vital signs reviewed and stable  Post vital signs: Reviewed and stable  Complications: No apparent anesthesia complications  Last Vitals:  Vitals Value Taken Time  BP 116/88 01/01/22 1030  Temp    Pulse 69 01/01/22 1033  Resp 12 01/01/22 1033  SpO2 99 % 01/01/22 1033  Vitals shown include unvalidated device data.  Last Pain:  Vitals:   01/01/22 0817  TempSrc: Oral  PainSc: 0-No pain      Patients Stated Pain Goal: 5 (01/01/22 0817)  Complications: No notable events documented.

## 2022-01-01 NOTE — Anesthesia Procedure Notes (Signed)
Procedure Name: LMA Insertion Date/Time: 01/01/2022 9:58 AM Performed by: Francie Massing, CRNA Pre-anesthesia Checklist: Patient identified, Emergency Drugs available, Suction available and Patient being monitored Patient Re-evaluated:Patient Re-evaluated prior to induction Oxygen Delivery Method: Circle system utilized Preoxygenation: Pre-oxygenation with 100% oxygen Induction Type: IV induction Ventilation: Mask ventilation without difficulty LMA: LMA inserted LMA Size: 4.0 Number of attempts: 1 Airway Equipment and Method: Bite block Placement Confirmation: positive ETCO2 Tube secured with: Tape Dental Injury: Teeth and Oropharynx as per pre-operative assessment

## 2022-01-01 NOTE — Interval H&P Note (Signed)
History and Physical Interval Note:  01/01/2022 9:22 AM  Sandra Ritter  has presented today for surgery, with the diagnosis of MAB.  The various methods of treatment have been discussed with the patient and family. After consideration of risks, benefits and other options for treatment, the patient has consented to  Procedure(s): DILATATION AND EVACUATION (N/A) as a surgical intervention.  The patient's history has been reviewed, patient examined, no change in status, stable for surgery.  I have reviewed the patient's chart and labs.  Questions were answered to the patient's satisfaction.     Hermina Staggers

## 2022-01-01 NOTE — Progress Notes (Signed)
Pt  difficult stick no need to repeat hemolyzed blood sample per Dr Krista Blue and Dr Alysia Penna

## 2022-01-01 NOTE — Op Note (Signed)
Dicy N Cartwright PROCEDURE DATE: 01/01/2022  PREOPERATIVE DIAGNOSIS: Retained products of conception POSTOPERATIVE DIAGNOSIS: The same PROCEDURE:     Dilation and Evacuation SURGEON:  Dr. Nettie Elm  INDICATIONS: 31 y.o. [id number] with retained products of conception at [redacted] weeks gestation, needing surgical completion.  Risks of surgery were discussed with the patient including but not limited to: bleeding which may require transfusion; infection which may require antibiotics; injury to uterus or surrounding organs; need for additional procedures including laparotomy or laparoscopy; possibility of intrauterine scarring which may impair future fertility; and other postoperative/anesthesia complications. Written informed consent was obtained.    FINDINGS:  A 10 week size uterus, moderate amounts of products of conception, specimen sent to pathology.  ANESTHESIA:    Monitored intravenous sedation, paracervical block. INTRAVENOUS FLUIDS:  As recorded ESTIMATED BLOOD LOSS:  Less than 300 ml. SPECIMENS:  Products of conception sent to pathology COMPLICATIONS:  None immediate.  PROCEDURE DETAILS:  The patient received intravenous Doxycycline while in the preoperative area.  She was then taken to the operating room where monitored intravenous sedation was administered and was found to be adequate.  After an adequate timeout was performed, she was placed in the dorsal lithotomy position and examined; then prepped and draped in the sterile manner.   Her bladder was catheterized for an unmeasured amount of clear, yellow urine. A vaginal speculum was then placed in the patient's vagina and a single tooth tenaculum was applied to the anterior lip of the cervix.   The cervix was gently dilated to accommodate a 8 mm suction curette that was gently advanced to the uterine fundus.  The suction device was then activated and curette slowly rotated to clear the uterus of products of conception.  A sharp curettage was  then performed to confirm complete emptying of the uterus. There was minimal bleeding noted and the tenaculum removed with good hemostasis noted.   All instruments were removed from the patient's vagina.  Sponge and instrument counts were correct times two  The patient tolerated the procedure well and was taken to the recovery area awake, and in stable condition.  The patient will be discharged to home as per PACU criteria.  Routine postoperative instructions given.  She was prescribed Percocet,  & Ibuprofen   She will follow up in the clinic in 3-4 weeks for postoperative evaluation.   Nettie Elm, MD, FACOG Attending Obstetrician & Gynecologist Faculty Practice, [facility]

## 2022-01-01 NOTE — Discharge Instructions (Addendum)
No acetaminophen/Tylenol until after 2:20pm today if needed for pain.    No ibuprofen, Advil, Aleve, Motrin, ketorolac, meloxicam, naproxen, or other NSAIDS until after 4:18pm today if needed for pain.    What to expect after your surgery: Expect to have vaginal bleeding/discharge for 2-3 days and spotting for up to 10 days. It is not unusual to have soreness for up to 1-2 weeks. You may have a slight burning sensation when you urinate for the first day. Mild cramps may continue for a couple of days. You may have a regular period in 2-6 weeks.  Call your doctor for any of the following:  Excessive vaginal bleeding or clotting, saturating and changing one pad every hour.  Inability to urinate 6 hours after discharge from hospital.  Pain not relieved by pain medication.  Fever of 100.4 F or greater.  Unusual vaginal discharge or odor.           Post Anesthesia Home Care Instructions  Activity: Get plenty of rest for the remainder of the day. A responsible individual must stay with you for 24 hours following the procedure.  For the next 24 hours, DO NOT: -Drive a car -Advertising copywriter -Drink alcoholic beverages -Take any medication unless instructed by your physician -Make any legal decisions or sign important papers.  Meals: Start with liquid foods such as gelatin or soup. Progress to regular foods as tolerated. Avoid greasy, spicy, heavy foods. If nausea and/or vomiting occur, drink only clear liquids until the nausea and/or vomiting subsides. Call your physician if vomiting continues.  Special Instructions/Symptoms: Your throat may feel dry or sore from the anesthesia or the breathing tube placed in your throat during surgery. If this causes discomfort, gargle with warm salt water. The discomfort should disappear within 24 hours.  If you had a scopolamine patch placed behind your ear for the management of post- operative nausea and/or vomiting:  1. The medication in  the patch is effective for 72 hours, after which it should be removed.  Wrap patch in a tissue and discard in the trash. Wash hands thoroughly with soap and water. 2. You may remove the patch earlier than 72 hours if you experience unpleasant side effects which may include dry mouth, dizziness or visual disturbances. 3. Avoid touching the patch. Wash your hands with soap and water after contact with the patch.

## 2022-01-02 ENCOUNTER — Encounter (HOSPITAL_BASED_OUTPATIENT_CLINIC_OR_DEPARTMENT_OTHER): Payer: Self-pay | Admitting: Obstetrics and Gynecology

## 2022-01-02 LAB — SURGICAL PATHOLOGY

## 2022-01-22 ENCOUNTER — Encounter: Payer: Self-pay | Admitting: Obstetrics and Gynecology

## 2022-01-22 ENCOUNTER — Other Ambulatory Visit: Payer: Self-pay

## 2022-01-22 ENCOUNTER — Ambulatory Visit (INDEPENDENT_AMBULATORY_CARE_PROVIDER_SITE_OTHER): Payer: No Typology Code available for payment source | Admitting: Obstetrics and Gynecology

## 2022-01-22 DIAGNOSIS — Z30011 Encounter for initial prescription of contraceptive pills: Secondary | ICD-10-CM

## 2022-01-22 DIAGNOSIS — Z9889 Other specified postprocedural states: Secondary | ICD-10-CM

## 2022-01-22 DIAGNOSIS — Z309 Encounter for contraceptive management, unspecified: Secondary | ICD-10-CM | POA: Insufficient documentation

## 2022-01-22 MED ORDER — LO LOESTRIN FE 1 MG-10 MCG / 10 MCG PO TABS: 1.0000 | ORAL_TABLET | Freq: Every day | ORAL | 11 refills | Status: DC

## 2022-01-22 NOTE — Patient Instructions (Signed)

## 2022-01-22 NOTE — BH Specialist Note (Deleted)
Integrated Behavioral Health via Telemedicine Visit  01/22/2022 Sandra Ritter [id number]  Number of Integrated Behavioral Health Clinician visits: No data recorded Session Start time: No data recorded  Session End time: No data recorded Total time in minutes: No data recorded  Referring Provider: *** Patient/Family location: *** [facility] Provider location: *** All persons participating in visit: *** Types of Service: {CHL AMB TYPE OF SERVICE:[id number]}  I connected with Sandra Ritter and/or Sandra Ritter's {family members:20773} via  Telephone or Engineer, civil (consulting)  (Video is Caregility application) and verified that I am speaking with the correct person using two identifiers. Discussed confidentiality: {YES/NO:21197}  I discussed the limitations of telemedicine and the availability of in person appointments.  Discussed there is a possibility of technology failure and discussed alternative modes of communication if that failure occurs.  I discussed that engaging in this telemedicine visit, they consent to the provision of behavioral healthcare and the services will be billed under their insurance.  Patient and/or legal guardian expressed understanding and consented to Telemedicine visit: {YES/NO:21197}  Presenting Concerns: Patient and/or family reports the following symptoms/concerns: *** Duration of problem: ***; Severity of problem: {Mild/Moderate/Severe:20260}  Patient and/or Family's Strengths/Protective Factors: {CHL AMB BH PROTECTIVE FACTORS:[phone]}  Goals Addressed: Patient will:  Reduce symptoms of: {IBH Symptoms:21014056}   Increase knowledge and/or ability of: {IBH Patient Tools:21014057}   Demonstrate ability to: {IBH Goals:21014053}  Progress towards Goals: {CHL AMB BH PROGRESS TOWARDS [phone]}  Interventions: Interventions utilized:  {IBH Interventions:21014054} Standardized Assessments completed: {IBH Screening  Tools:21014051}  Patient and/or Family Response: ***  Assessment: Patient currently experiencing ***.   Patient may benefit from ***.  Plan: Follow up with behavioral health clinician on : *** Behavioral recommendations: *** Referral(s): {IBH Referrals:21014055}  I discussed the assessment and treatment plan with the patient and/or parent/guardian. They were provided an opportunity to ask questions and all were answered. They agreed with the plan and demonstrated an understanding of the instructions.   They were advised to call back or seek an in-person evaluation if the symptoms worsen or if the condition fails to improve as anticipated.  Rae Lips, LCSW     01/22/2022    9:02 AM 01/01/2022    8:05 AM 01/20/2020   10:59 AM  Depression screen PHQ 2/9  Decreased Interest 0 0 0  Down, Depressed, Hopeless 2 2 0  PHQ - 2 Score 2 2 0  Altered sleeping 1 1 0  Tired, decreased energy 1 3 1   Change in appetite 3 0 0  Feeling bad or failure about yourself  1 1 0  Trouble concentrating 0 0 0  Moving slowly or fidgety/restless 0 0 0  Suicidal thoughts 0 0   PHQ-9 Score 8 7 1   Difficult doing work/chores   Not difficult at all      01/22/2022    9:02 AM 01/01/2022    8:06 AM 01/20/2020   10:59 AM  GAD 7 : Generalized Anxiety Score  Nervous, Anxious, on Edge 2 1 0  Control/stop worrying 3 1 0  Worry too much - different things 3 3 0  Trouble relaxing 3 2 0  Restless 2 2 0  Easily annoyed or irritable 3 3 1   Afraid - awful might happen 1 1 0  Total GAD 7 Score 17 13 1

## 2022-01-22 NOTE — Progress Notes (Signed)
Sandra Ritter presents for post op for Suction D & C for retained POC's Pathology reviewed with pt Pt reports doing well No bleeding Denies any bowel or bladder dysfunction Desires contraception + smoker Pap smear UTD  PE AF VSS Lungs clear Heart RRR Abd soft + BS  A/P Post op appt        Contraception management  Return to nl ADL's. Discussed contraception options with pt. Pt desires OCP's. U/R/B and back up method reviewed with pt. F/U 1 yr or PRN

## 2022-01-22 NOTE — Progress Notes (Signed)
Patient has elevated gad7- would like appt with Asher Muir. Will be scheduled at check out

## 2022-02-05 ENCOUNTER — Encounter: Payer: Self-pay | Admitting: Clinical

## 2022-02-05 NOTE — BH Specialist Note (Signed)
Integrated Behavioral Health via Telemedicine Visit  02/14/2022 Sandra Ritter [id number]  Number of Integrated Behavioral Health Clinician visits: 1- Initial Visit  Session Start time: 0917   Session End time: 0958  Total time in minutes: 41   Referring Provider: Nettie Elm, MD Patient/Family location: [facility] Provider location: Center for [name]  All persons participating in visit: Patient Pharmacist, community and [facility] Sandra Ritter   Types of Service: Individual psychotherapy and Telephone visit  I connected with Sandra Ritter and/or Sandra Ritter's  Sandra/a  via  Telephone or Video Enabled Telemedicine Application  (Video is Caregility application) and verified that I am speaking with the correct person using two identifiers. Discussed confidentiality: Yes   I discussed the limitations of telemedicine and the availability of in person appointments.  Discussed there is a possibility of technology failure and discussed alternative modes of communication if that failure occurs.  I discussed that engaging in this telemedicine visit, they consent to the provision of behavioral healthcare and the services will be billed under their insurance.  Patient and/or legal guardian expressed understanding and consented to Telemedicine visit: Yes   Presenting Concerns: Patient and/or family reports the following symptoms/concerns: Anxiety, social anxiety, panic attacks, lack of quality sleep, increase after recent pregnancy loss; open to implementing self-coping strategies and starting medication; requests referral to ongoing therapy.  Duration of problem: Panic began 2-2.5 years ago; social anxiety ongoing; Severity of problem: moderate  Patient and/or Family's Strengths/Protective Factors: Concrete supports in place (healthy food, safe environments, etc.), Sense of purpose, and Physical Health (exercise, healthy diet, medication compliance,  etc.)  Goals Addressed: Patient will:  Reduce symptoms of: anxiety and stress   Increase knowledge and/or ability of: healthy habits and self-management skills   Demonstrate ability to: Increase motivation to adhere to plan of care  Progress towards Goals: Ongoing  Interventions: Interventions utilized:  Mindfulness or Management consultant, Sleep Hygiene, and Psychoeducation and/or Health Education Standardized Assessments completed: GAD-7 and PHQ 9  Patient and/or Family Response: Patient agrees with treatment plan.   Assessment: Patient currently experiencing Anxiety disorder, unspecified.   Patient may benefit from psychoeducation and brief therapeutic interventions regarding coping with symptoms of anxiety and current life stress .  Plan: Follow up with behavioral health clinician on : Two weeks Behavioral recommendations:  -CALM relaxation breathing exercise twice daily (morning; at bedtime with sleep sounds); as needed throughout the day. -Accept referral to [facility] outpatient services; use walk-in hours as needed -Read educational materials regarding coping with symptoms of anxiety with panic attack (on After Visit Summary)  Referral(s): Integrated Art gallery manager (In Clinic) and [facility] (LME/Outside Clinic)  I discussed the assessment and treatment plan with the patient and/or parent/guardian. They were provided an opportunity to ask questions and all were answered. They agreed with the plan and demonstrated an understanding of the instructions.   They were advised to call back or seek an in-person evaluation if the symptoms worsen or if the condition fails to improve as anticipated.  Rae Lips, LCSW     01/22/2022    9:02 AM 01/01/2022    8:05 AM 01/20/2020   10:59 AM  Depression screen PHQ 2/9  Decreased Interest 0 0 0  Down, Depressed, Hopeless 2 2 0  PHQ - 2 Score 2 2 0  Altered sleeping 1 1 0  Tired, decreased energy 1  3 1   Change in appetite 3 0 0  Feeling bad or failure about yourself  1 1 0  Trouble concentrating 0 0 0  Moving slowly or fidgety/restless 0 0 0  Suicidal thoughts 0 0   PHQ-9 Score 8 7 1   Difficult doing work/chores   Not difficult at all      01/22/2022    9:02 AM 01/01/2022    8:06 AM 01/20/2020   10:59 AM  GAD 7 : Generalized Anxiety Score  Nervous, Anxious, on Edge 2 1 0  Control/stop worrying 3 1 0  Worry too much - different things 3 3 0  Trouble relaxing 3 2 0  Restless 2 2 0  Easily annoyed or irritable 3 3 1   Afraid - awful might happen 1 1 0  Total GAD 7 Score 17 13 1

## 2022-02-14 ENCOUNTER — Ambulatory Visit (INDEPENDENT_AMBULATORY_CARE_PROVIDER_SITE_OTHER): Payer: No Typology Code available for payment source | Admitting: Clinical

## 2022-02-14 DIAGNOSIS — F419 Anxiety disorder, unspecified: Secondary | ICD-10-CM | POA: Diagnosis not present

## 2022-02-14 NOTE — Patient Instructions (Addendum)
Center for [facility] Healthcare at [facility] [address] [place], [place] [address] [phone] (main office) [phone] [facility] office)  [facility]  [address] [place], [place] [address] [phone] or [phone] [facility] 24/7 FOR ANYONE [address] [place], [place]  [phone] Fax: [phone] [email or web address] *Interpreters available *Accepts all insurance and uninsured for Urgent Care needs *Accepts Medicaid and uninsured for outpatient treatment     ONLY FOR [facility]  New patient assessment and therapy walk-ins Mondays and Wednesdays 8am-11am First and second Fridays 1pm-5pm  New patient psychiatry and medication management walk-ins:  Mondays, Wednesdays, Thursdays, Fridays 8am -11am NO PSYCHIATRY WALK-INS on TUESDAYS    Coping with Panic Attacks   What is a panic attack?  You may have had a panic attack if you experienced four or more of the symptoms listed below coming on abruptly and peaking in about 10 minutes.  Panic Symptoms    Pounding heart   Sweating   Trembling or shaking   Shortness of breath   Feeling of choking   Chest pain   Nausea or abdominal distress     Feeling dizzy, unsteady, lightheaded, or faint   Feelings of unreality or being detached from yourself   Fear of losing control or going crazy   Fear of dying   Numbness or tingling   Chills or hot flashes      Panic attacks are sometimes accompanied by avoidance of certain places or situations. These are often situations that would be difficult to escape from or in which help might not be available. Examples might include crowded shopping malls, public transportation, restaurants, or driving.   Why do panic attacks occur?   Panic attacks are the body's alarm system gone awry. All of [place] have a built-in alarm system, powered by adrenaline, which increases our heart rate, breathing, and blood flow in  response to danger. Ordinarily, this 'danger response system' works well. In some people, however, the response is either out of proportion to whatever stress is going on, or may come out of the blue without any stress at all.   For example, if you are walking in the woods and see a bear coming your way, a variety of changes occur in your body to prepare you to either fight the danger or flee from the situation. Your heart rate will increase to get more blood flow around your body, your breathing rate will quicken so that more oxygen is available, and your muscles will tighten in order to be ready to fight or run. You may feel nauseated as blood flow leaves your stomach area and moves into your limbs. These bodily changes are all essential to helping you survive the dangerous situation. After the danger has passed, your body functions will begin to go back to normal. This is because your body also has a system for "recovering" by bringing your body back down to a normal state when the danger is over.   As you can see, the emergency response system is adaptive when there is, in fact, a "true" or "real" danger (e.g., bear). However, sometimes people find that their emergency response system is triggered in "everyday" situations where there really is no true physical danger (e.g., in a meeting, in the grocery store, while driving in normal traffic, etc.).   What triggers a panic attack?  Sometimes particularly stressful situations can trigger a panic attack. For example, an argument with your spouse or stressors at  work can cause a stress response (activating the emergency response system) because you perceive it as threatening or overwhelming, even if there is no direct risk to your survival.  Sometimes panic attacks don't seem to be triggered by anything in particular- they may "come out of the blue". Somehow, the natural "fight or flight" emergency response system has gotten activated when there is no real  danger. Why does the body go into "emergency mode" when there is no real danger?   Often, people with panic attacks are frightened or alarmed by the physical sensations of the emergency response system. First, unexpected physical sensations are experienced (tightness in your chest or some shortness of breath). This then leads to feeling fearful or alarmed by these symptoms ("Something's wrong!", "Am I having a heart attack?", "Am I going to faint?") The mind perceives that there is a danger even though no real danger exists. This, in turn, activates the emergency response system ("fight or flight"), leading to a "full blown" panic attack. In summary, panic attacks occur when we misinterpret physical symptoms as signs of impending death, craziness, loss of control, embarrassment, or fear of fear. Sometimes you may be aware of thoughts of danger that activate the emergency response system (for example, thinking "I'm having a heart attack" when you feel chest pressure or increased heart rate). At other times, however, you may not be aware of such thoughts. After several incidences of being afraid of physical sensations, anxiety and panic can occur in response to the initial sensations without conscious thoughts of danger. Instead, you just feel afraid or alarmed. In other words, the panic or fear may seem to occur "automatically" without you consciously telling yourself anything.   After having had one or more panic attacks, you may also become more focused on what is going on inside your body. You may scan your body and be more vigilant about noticing any symptoms that might signal the start of a panic attack. This makes it easier for panic attacks to happen again because you pick up on sensations you might otherwise not have noticed, and misinterpret them as something dangerous. A panic attack may then result.      How do I cope with panic attacks?  An important part of overcoming panic attacks involves  re-interpreting your body's physical reactions and teaching yourself ways to decrease the physical arousal. This can be done through practicing the cognitive and behavioral interventions below.   Research has found that over half of people who have panic attacks show some signs of hyperventilation or overbreathing. This can produce initial sensations that alarm you and lead to a panic attack. Overbreathing can also develop as part of the panic attack and make the symptoms worse. When people hyperventilate, certain blood vessels in the body become narrower. In particular, the brain may get slightly less oxygen. This can lead to the symptoms of dizziness, confusion, and lightheadedness that often occur during panic attacks. Other parts of the body may also get a bit less oxygen, which may lead to numbness or tingling in the hands or feet or the sensation of cold, clammy hands. It also may lead the heart to pump harder. Although these symptoms may be frightening and feel unpleasant, it is important to remember that hyperventilating is not dangerous. However, you can help overcome the unpleasantness of overbreathing by practicing Breathing Retraining.   Practice this basic technique three times a day, every day:   Inhale. With your shoulders relaxed, inhale as slowly  and deeply as you can while you count to six. If you can, use your diaphragm to fill your lungs with air.   Hold. Keep the air in your lungs as you slowly count to four.   Exhale. Slowly breath out as you count to six.   Repeat. Do the inhale-hold-exhale cycle several times. Each time you do it, exhale for longer counts.  Like any new skill, Breathing Retraining requires practice. Try practicing this skill twice a day for several minutes. Initially, do not try this technique in specific situations or when you become frightened or have a panic attack. Begin by practicing in a quiet environment to build up your skill level so that you can later use  it in time of "emergency."   2. Decreasing Avoidance  Regardless of whether you can identify why you began having panic attacks or whether they seemed to come out of the blue, the places where you began having panic attacks often can become triggers themselves. It is not uncommon for individuals to begin to avoid the places where they have had panic attacks. Over time, the individual may begin to avoid more and more places, thereby decreasing their activities and often negatively impacting their quality of life. To break the cycle of avoidance, it is important to first identify the places or situations that are being avoided, and then to do some "relearning."  To begin this intervention, first create a list of locations or situations that you tend to avoid. Then choose an avoided location or situation that you would like to target first. Now develop an "exposure hierarchy" for this situation or location. An "exposure hierarchy" is a list of actions that make you feel anxious in this situation. Order these actions from least to most anxiety-producing. It is often helpful to have the first item on your hierarchy involve thinking or imagining part of the feared/avoided situation.   Here is an example of an exposure hierarchy for decreasing avoidance of the grocery store. Note how it is ordered from the least amount of anxiety (at the top) to the most anxiety (at the bottom):   Think about going to the grocery store alone.   Go to the grocery store with a friend or family member.   Go to the grocery store alone to pick up a few small items (5-10 minutes in the store).   Shopping for 10-20 minutes in the store alone.   Doing the shopping for the week by myself (20-30 minutes in the store).   Your homework is to "expose" yourself to the lowest item on your hierarchy and use your breathing relaxation and coping statements (see below) to help you remain in the situation. Practice this several times during the  upcoming week. Once you have mastered each item with minimal anxiety, move on to the next higher action on your list.   Cognitive Interventions  Identify your negative self-talk Anxious thoughts can increase anxiety symptoms and panic. The first step in changing anxious thinking is to identify your own negative, alarming self-talk. Some common alarming thoughts:  I'm having a heart attack.            I must be going crazy. I think I'm dying. People will think I'm crazy. I'm going to pass our.  Oh no- here it comes.  I can't stand this.  I've got to get out of here!  2. Use positive coping statements Changing or disrupting a pattern of anxious thoughts by replacing them with  more calming or supportive statements can help to divert a panic attack. Some common helpful coping statements:  This is not an emergency.  I don't like feeling this way, but I can accept it.  I can feel like this and still be okay.  This has happened before, and I was okay. I'll be okay this time, too.  I can be anxious and still deal with this situation.

## 2022-02-19 NOTE — BH Specialist Note (Signed)
Pt did not arrive to video visit and did not answer the phone; Left HIPPA-compliant message to call back Hyman Crossan from [facility] at  [phone] (Lukis Bunt's office).  ?; left MyChart message for patient.  ? ?

## 2022-03-05 ENCOUNTER — Ambulatory Visit: Payer: No Typology Code available for payment source | Admitting: Clinical

## 2022-03-05 DIAGNOSIS — Z91199 Patient's noncompliance with other medical treatment and regimen due to unspecified reason: Secondary | ICD-10-CM

## 2022-05-25 IMAGING — US US OB < 14 WEEKS - US OB TV
1 series · 14 of 28 positions shown · non-contrast
Comparison: None.

CLINICAL DATA: Evaluate for retained products of conception.

EXAM:
OBSTETRIC <14 WK US AND TRANSVAGINAL OB US
TECHNIQUE: Both transabdominal and transvaginal ultrasound examinations were
performed for complete evaluation of the gestation as well as the
maternal uterus, adnexal regions, and pelvic cul-de-sac.
Transvaginal technique was performed to assess early pregnancy.

[Series 1: us ob < 14 weeks - us ob tv · 74 acquisitions, 14 frames shown]
[im 3/74]
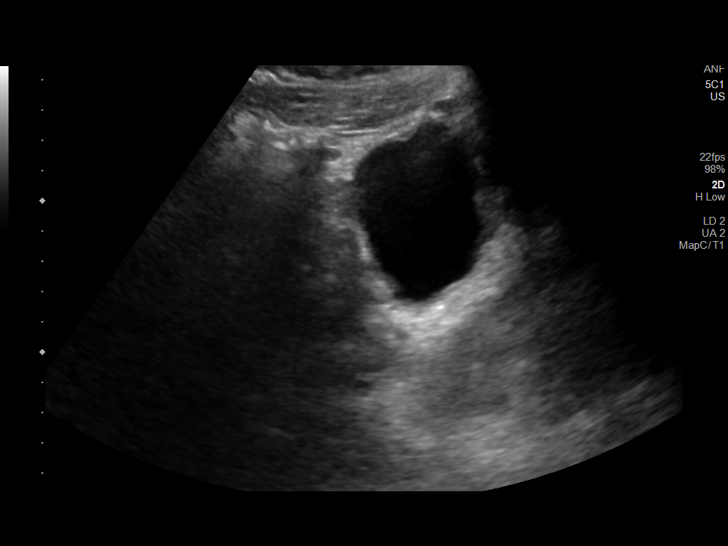
[im 9/74]
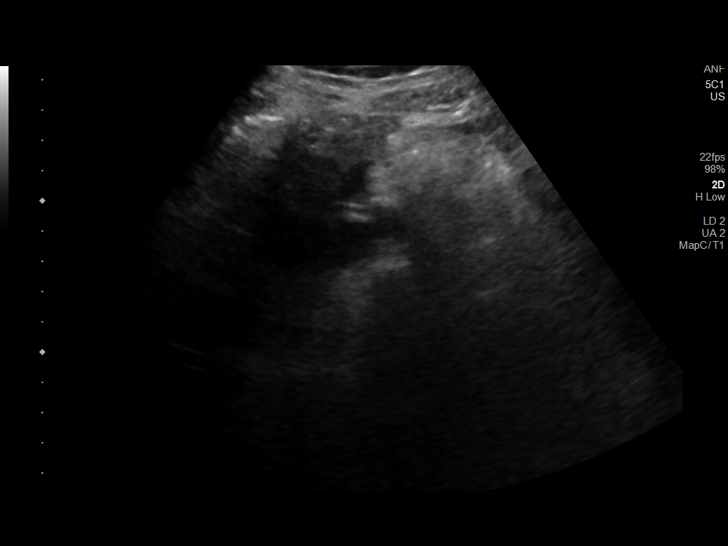
[im 14/74]
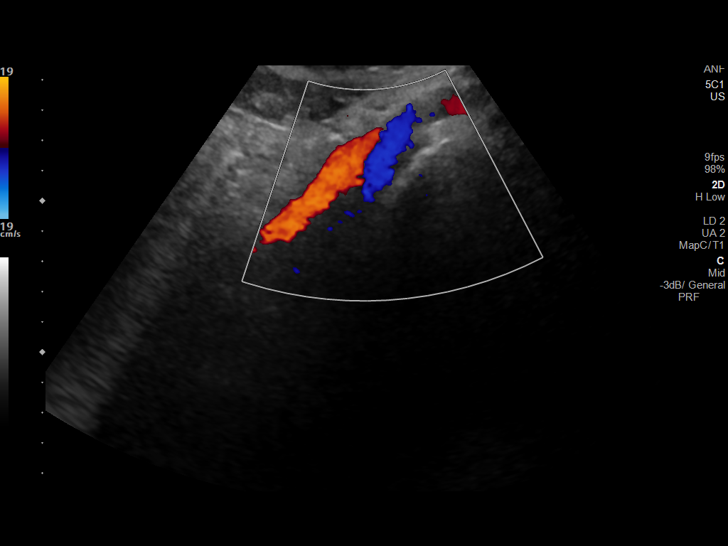
[im 19/74]
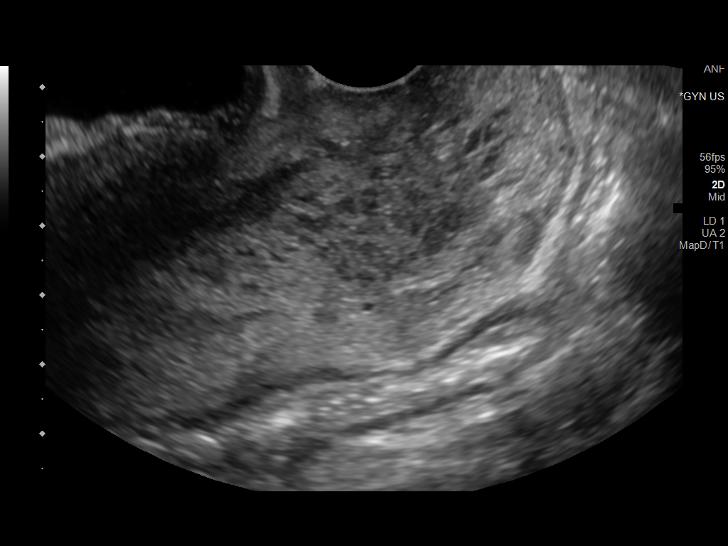
[im 25/74]
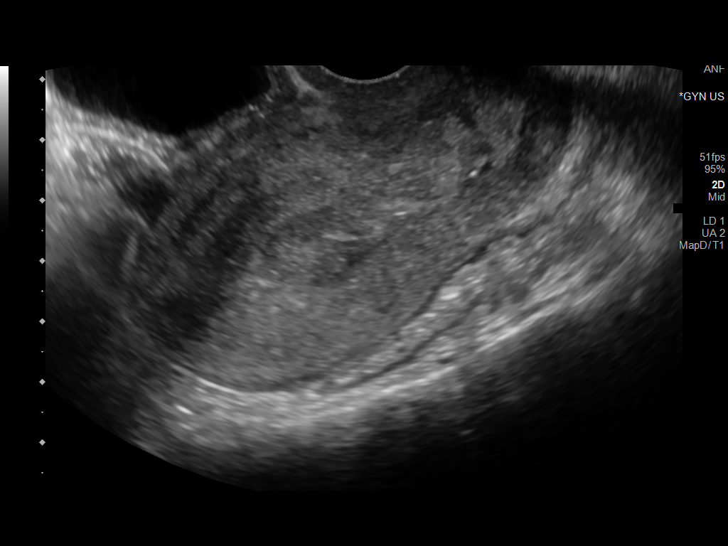
[im 30/74]
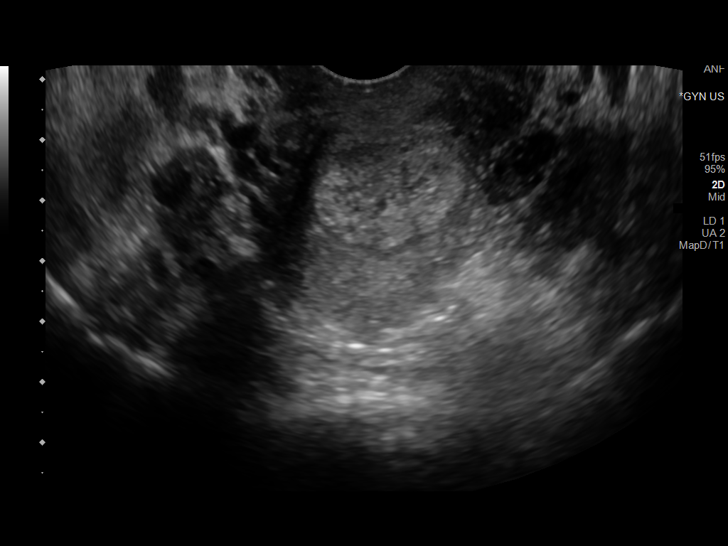
[im 36/74]
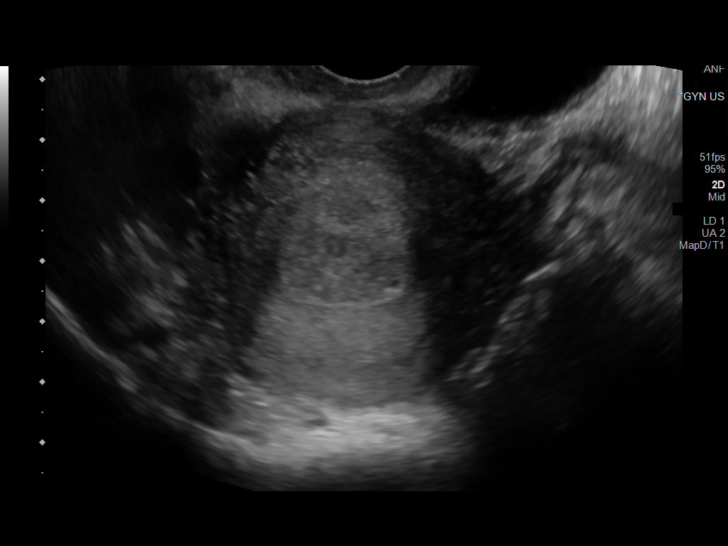
[im 41/74]
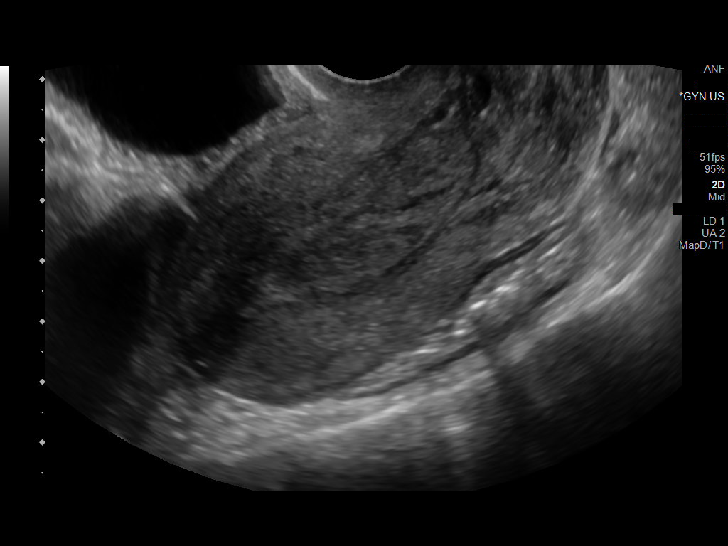
[im 46/74]
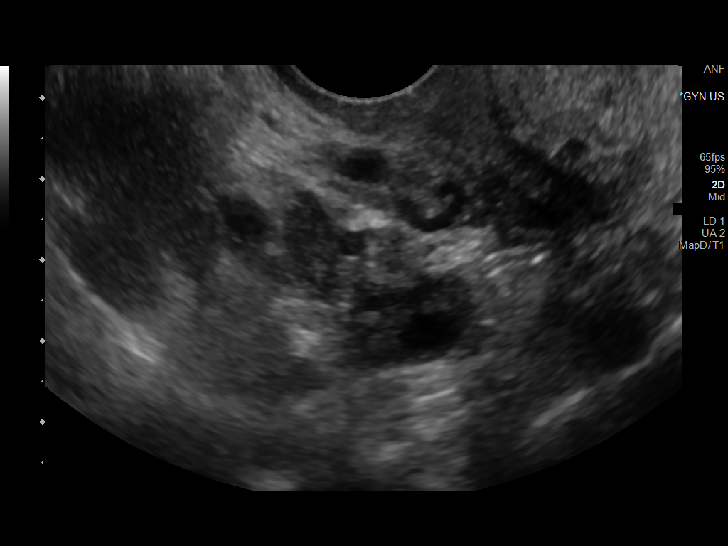
[im 52/74]
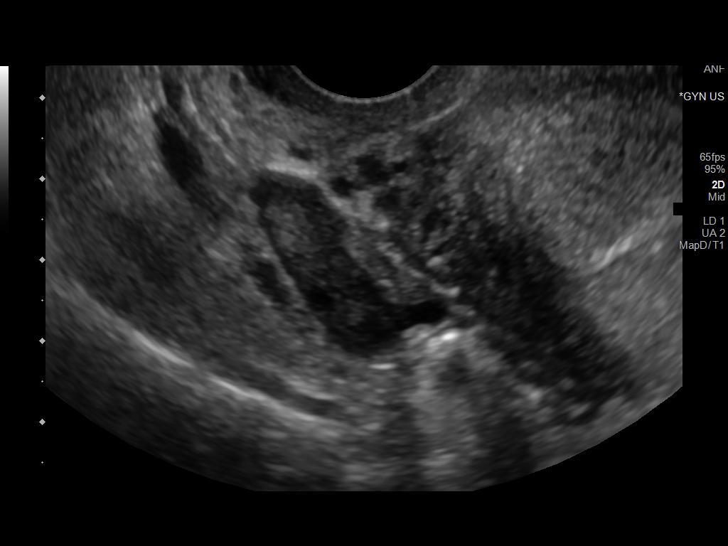
[im 57/74]
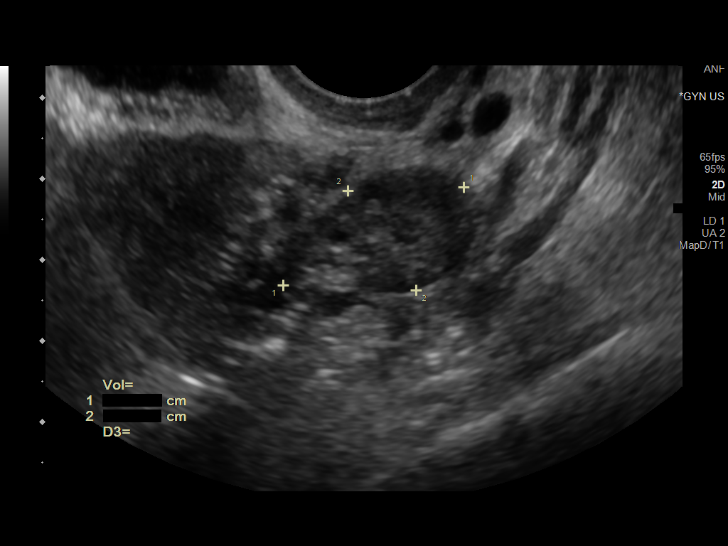
[im 63/74]
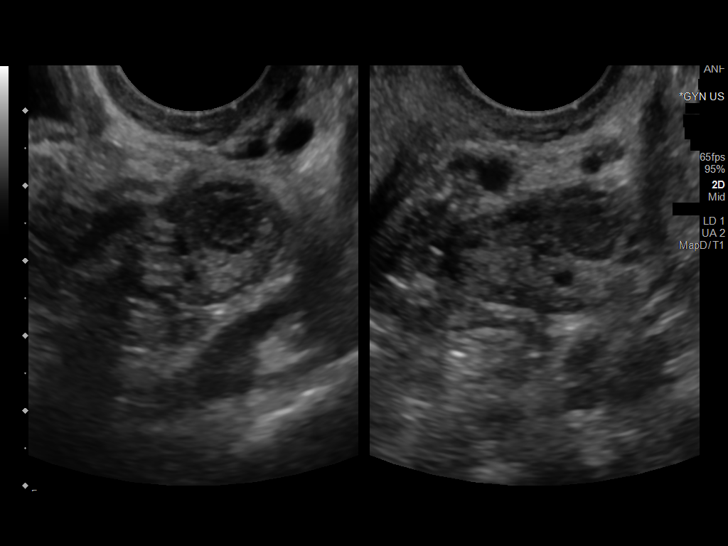
[im 68/74]
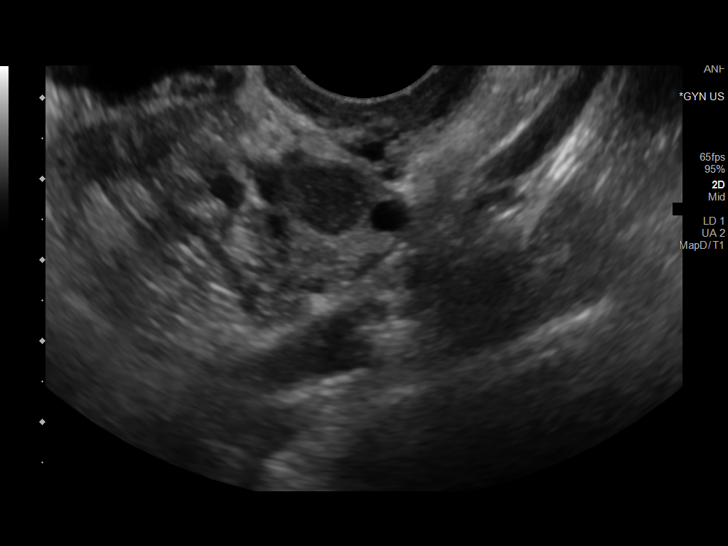
[im 74/74]
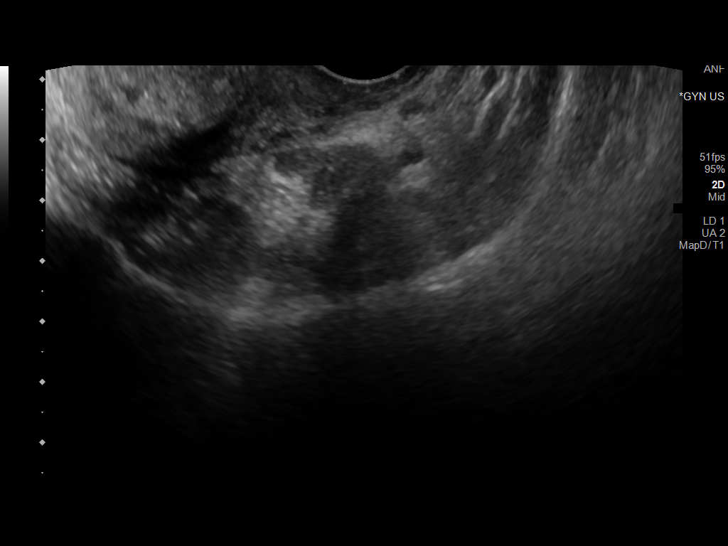

[14 of 28 positions shown; findings below may reference images not displayed]

FINDINGS: Intrauterine gestational sac: None

Yolk sac:  Not Visualized.

Embryo:  Not Visualized.

Cardiac Activity: Not Visualized.

Heart Rate: N/A  bpm

Subchorionic hemorrhage:  None visualized.

Maternal uterus/adnexae:

There is thickening of the endometrium (2.02 cm) which also
demonstrates hypervascular flow.

The right ovary is visualized and is normal in appearance.

A 0.9 cm x 0.9 cm x 1.0 cm complex appearing anechoic area is seen
within the left adnexa.

No free fluid is seen.
IMPRESSION: 1. Findings consistent with retained products of conception within
the endometrial canal.
2. Small complex left ovarian cyst.

## 2022-06-03 ENCOUNTER — Inpatient Hospital Stay (HOSPITAL_COMMUNITY): Payer: No Typology Code available for payment source

## 2022-06-03 ENCOUNTER — Inpatient Hospital Stay (HOSPITAL_COMMUNITY)
Admission: AD | Admit: 2022-06-03 | Discharge: 2022-06-03 | Disposition: A | Discharge status: Home / Self Care | Payer: No Typology Code available for payment source | Attending: Obstetrics and Gynecology | Admitting: Obstetrics and Gynecology

## 2022-06-03 ENCOUNTER — Other Ambulatory Visit: Payer: Self-pay

## 2022-06-03 ENCOUNTER — Encounter (HOSPITAL_COMMUNITY): Payer: Self-pay | Admitting: Obstetrics and Gynecology

## 2022-06-03 DIAGNOSIS — O208 Other hemorrhage in early pregnancy: Secondary | ICD-10-CM | POA: Diagnosis present

## 2022-06-03 DIAGNOSIS — Z3A01 Less than 8 weeks gestation of pregnancy: Secondary | ICD-10-CM | POA: Insufficient documentation

## 2022-06-03 DIAGNOSIS — Z3491 Encounter for supervision of normal pregnancy, unspecified, first trimester: Secondary | ICD-10-CM

## 2022-06-03 DIAGNOSIS — O418X10 Other specified disorders of amniotic fluid and membranes, first trimester, not applicable or unspecified: Secondary | ICD-10-CM | POA: Diagnosis not present

## 2022-06-03 LAB — COMPREHENSIVE METABOLIC PANEL
ALT: 9 U/L (ref 0–44)
AST: 16 U/L (ref 15–41)
Albumin: 3.8 g/dL (ref 3.5–5.0)
Alkaline Phosphatase: 46 U/L (ref 38–126)
Anion gap: 14 (ref 5–15)
BUN: 5 mg/dL — ABNORMAL LOW (ref 6–20)
CO2: 22 mmol/L (ref 22–32)
Calcium: 9.7 mg/dL (ref 8.9–10.3)
Chloride: 102 mmol/L (ref 98–111)
Creatinine, Ser: 0.64 mg/dL (ref 0.44–1.00)
GFR, Estimated: >60 mL/min (ref >60)
Glucose, Bld: 85 mg/dL (ref 70–99)
Potassium: 3.4 mmol/L — ABNORMAL LOW (ref 3.5–5.1)
Sodium: 138 mmol/L (ref 135–145)
Total Bilirubin: 0.4 mg/dL (ref 0.3–1.2)
Total Protein: 6.6 g/dL (ref 6.5–8.1)

## 2022-06-03 LAB — CBC WITH DIFFERENTIAL/PLATELET
Abs Immature Granulocytes: 0.02 10*3/uL (ref 0.00–0.07)
Basophils Absolute: 0 10*3/uL (ref 0.0–0.1)
Basophils Relative: 0 %
Eosinophils Absolute: 0.2 10*3/uL (ref 0.0–0.5)
Eosinophils Relative: 2 %
HCT: 36.9 % (ref 36.0–46.0)
Hemoglobin: 12.6 g/dL (ref 12.0–15.0)
Immature Granulocytes: 0 %
Lymphocytes Relative: 28 %
Lymphs Abs: 2.6 10*3/uL (ref 0.7–4.0)
MCH: 27.8 pg (ref 26.0–34.0)
MCHC: 34.1 g/dL (ref 30.0–36.0)
MCV: 81.5 fL (ref 80.0–100.0)
Monocytes Absolute: 0.4 10*3/uL (ref 0.1–1.0)
Monocytes Relative: 4 %
Neutro Abs: 6 10*3/uL (ref 1.7–7.7)
Neutrophils Relative %: 66 %
Platelets: 295 10*3/uL (ref 150–400)
RBC: 4.53 MIL/uL (ref 3.87–5.11)
RDW: 12 % (ref 11.5–15.5)
WBC: 9.3 10*3/uL (ref 4.0–10.5)
nRBC: 0 % (ref 0.0–0.2)

## 2022-06-03 LAB — WET PREP, GENITAL
Clue Cells Wet Prep HPF POC: NONE SEEN
Sperm: NONE SEEN
Trich, Wet Prep: NONE SEEN
WBC, Wet Prep HPF POC: >=10 — AB (ref <10)
Yeast Wet Prep HPF POC: NONE SEEN

## 2022-06-03 LAB — URINALYSIS, ROUTINE W REFLEX MICROSCOPIC
Bilirubin Urine: NEGATIVE
Glucose, UA: NEGATIVE mg/dL
Ketones, ur: NEGATIVE mg/dL
Nitrite: NEGATIVE
Protein, ur: NEGATIVE mg/dL
Specific Gravity, Urine: 1.006 (ref 1.005–1.030)
pH: 6 (ref 5.0–8.0)

## 2022-06-03 LAB — HCG, QUANTITATIVE, PREGNANCY: hCG, Beta Chain, Quant, S: 153674 m[IU]/mL — ABNORMAL HIGH (ref <5)

## 2022-06-03 LAB — POCT PREGNANCY, URINE: Preg Test, Ur: POSITIVE — AB

## 2022-06-03 NOTE — Discharge Instructions (Signed)

## 2022-06-03 NOTE — Progress Notes (Signed)
Written and verbal d/c instructions given and understanding voiced. 

## 2022-06-03 NOTE — [facility]U Note (Signed)
Len Blalock CNM in [place] Rm to discuss test results and d/c plan with pt.

## 2022-06-03 NOTE — MAU Note (Signed)
Sandra Ritter is a 31 y.o. at [facility] here in [facility] reporting: she's [redacted] weeks pregnant and has VB since yesterday.  Reports VB is dark brown spotting with wiping.  Reports has dull cramping daily. LMP: 04/21/2022 Onset of complaint: yesterday Pain score: 4 Vitals:   06/03/22 1853  BP: 119/73  Pulse: 92  Resp: 18  Temp: 98.4 F (36.9 C)  SpO2: 97%     FHT:NA Lab orders placed from triage:   UPT

## 2022-06-03 NOTE — MAU Provider Note (Cosign Needed Addendum)
History     CSN: [id number]  Arrival date and time: 06/03/22 1824   None     Chief Complaint  Patient presents with   Vaginal Bleeding   HPI  Ms.Sandra Ritter is a 31 y.o. female G68P1011 @ [redacted]w[redacted]d here with light/ brown discharge. She reports symptoms started yesterday. Yesterday she noted it in her underwear and today she notices it only when she wipes. She has daily lower abdominal cramping. This started prior to pregnancy. She rates her pain 4/10 which is not any worse than any other day.   OB History     Gravida  4   Para  1   Term  1   Preterm      AB  1   Living  1      SAB      IAB  1   Ectopic      Multiple      Live Births           Obstetric Comments  G1 2020: SVD, no issues G2 may 2021: 9wk [facility]         Past Medical History:  Diagnosis Date   Chlamydia 01/23/2020   history of acute renal failure 2018   treated @ [facility] for 5 to 7 days all symptoms resolved, no current nephrologist   History of Chronic hepatitis C ([place])    pt took tx for with [employer] july to august 2021   History of coronary vasospasm    saw cardiology 10-21-2019 note in epic, last symptom was 3 months ago per pt on 12-31-2021   Sepsis [facility])    Substance abuse ([place])    last cocaine use 2018 or 2019 per pt on 12-31-2021   Wears glasses or contacts 12/31/2021    Past Surgical History:  Procedure Laterality Date   BLADDER SURGERY     "bladder tack" age 28   DILATION AND EVACUATION N/A 01/02/2020   Procedure: Suction D&C;  Surgeon: Aletha Halim, MD;  Location: [facility];  Service: Gynecology;  Laterality: N/A;   DILATION AND EVACUATION N/A 01/01/2022   Procedure: DILATATION AND EVACUATION;  Surgeon: Chancy Milroy, MD;  Location: [facility];  Service: Gynecology;  Laterality: N/A;    No family history on file.  Social History   Tobacco Use   Smoking status: Every Day    Packs/day: 0.50    Types:  Cigarettes    Passive exposure: Never   Smokeless tobacco: Never  Vaping Use   Vaping Use: Never used  Substance Use Topics   Alcohol use: Yes    Comment: social   Drug use: Not Currently    Comment: last cocaine use 2018 or 2019 per pt 12-31-2021    Allergies: No Known Allergies  Medications Prior to Admission  Medication Sig Dispense Refill Last Dose   buprenorphine (SUBUTEX) 2 MG SUBL SL tablet Place 8 mg under the tongue 3 (three) times daily before meals.      ibuprofen (ADVIL) 800 MG tablet Take 1 tablet (800 mg total) by mouth every 8 (eight) hours as needed. 30 tablet 0    LO LOESTRIN FE 1 MG-10 MCG / 10 MCG tablet Take 1 tablet by mouth daily. 28 tablet 11    Results for orders placed or performed during the hospital encounter of 06/03/22 (from the past 48 hour(s))  Pregnancy, urine POC     Status: Abnormal  Collection Time: 06/03/22  6:56 PM  Result Value Ref Range   Preg Test, Ur POSITIVE (A) NEGATIVE    Comment:        THE SENSITIVITY OF THIS METHODOLOGY IS >24 mIU/mL   ABO/Rh     Status: None (Preliminary result)   Collection Time: 06/03/22  7:20 PM  Result Value Ref Range   ABO/RH(D) PENDING      Review of Systems  Gastrointestinal:  Positive for abdominal pain.  Genitourinary:  Positive for vaginal bleeding and vaginal discharge.   Physical Exam   Blood pressure 119/73, pulse 92, temperature 98.4 F (36.9 C), temperature source Oral, resp. rate 18, height 5' 5.75" (1.67 m), weight 77.8 kg, last menstrual period 04/21/2022, SpO2 97 %.  Physical Exam Vitals and nursing note reviewed.  Constitutional:      General: She is not in acute distress.    Appearance: She is well-developed.  HENT:     Head: Normocephalic.  Eyes:     Pupils: Pupils are equal, round, and reactive to light.  Cardiovascular:     Rate and Rhythm: Normal rate and regular rhythm.     Heart sounds: Normal heart sounds.  Pulmonary:     Effort: Pulmonary effort is normal. No  respiratory distress.     Breath sounds: Normal breath sounds.  Abdominal:     General: Bowel sounds are normal. There is no distension.     Palpations: Abdomen is soft.     Tenderness: There is no abdominal tenderness.  Skin:    General: Skin is warm and dry.  Neurological:     Mental Status: She is alert and oriented to person, place, and time.  Psychiatric:        Mood and Affect: Mood normal.        Behavior: Behavior normal.        Thought Content: Thought content normal.        Judgment: Judgment normal.     MAU Course  Procedures Results for orders placed or performed during the hospital encounter of 06/03/22 (from the past 24 hour(s))  Urinalysis, Routine w reflex microscopic Urine, Clean Catch     Status: Abnormal   Collection Time: 06/03/22  6:24 PM  Result Value Ref Range   Color, Urine YELLOW YELLOW   APPearance CLEAR CLEAR   Specific Gravity, Urine 1.006 1.005 - 1.030   pH 6.0 5.0 - 8.0   Glucose, UA NEGATIVE NEGATIVE mg/dL   Hgb urine dipstick SMALL (A) NEGATIVE   Bilirubin Urine NEGATIVE NEGATIVE   Ketones, ur NEGATIVE NEGATIVE mg/dL   Protein, ur NEGATIVE NEGATIVE mg/dL   Nitrite NEGATIVE NEGATIVE   Leukocytes,Ua TRACE (A) NEGATIVE   RBC / HPF 0-5 0 - 5 RBC/hpf   WBC, UA 0-5 0 - 5 WBC/hpf   Bacteria, UA FEW (A) NONE SEEN   Squamous Epithelial / LPF 0-5 0 - 5  Pregnancy, urine POC     Status: Abnormal   Collection Time: 06/03/22  6:56 PM  Result Value Ref Range   Preg Test, Ur POSITIVE (A) NEGATIVE  CBC with Differential/Platelet     Status: None   Collection Time: 06/03/22  7:20 PM  Result Value Ref Range   WBC 9.3 4.0 - 10.5 K/uL   RBC 4.53 3.87 - 5.11 MIL/uL   Hemoglobin 12.6 12.0 - 15.0 g/dL   HCT 14.4 81.8 - 56.3 %   MCV 81.5 80.0 - 100.0 fL   MCH 27.8 26.0 - 34.0 pg  MCHC 34.1 30.0 - 36.0 g/dL   RDW 99.8 33.8 - 25.0 %   Platelets 295 150 - 400 K/uL   nRBC 0.0 0.0 - 0.2 %   Neutrophils Relative % 66 %   Neutro Abs 6.0 1.7 - 7.7 K/uL    Lymphocytes Relative 28 %   Lymphs Abs 2.6 0.7 - 4.0 K/uL   Monocytes Relative 4 %   Monocytes Absolute 0.4 0.1 - 1.0 K/uL   Eosinophils Relative 2 %   Eosinophils Absolute 0.2 0.0 - 0.5 K/uL   Basophils Relative 0 %   Basophils Absolute 0.0 0.0 - 0.1 K/uL   Immature Granulocytes 0 %   Abs Immature Granulocytes 0.02 0.00 - 0.07 K/uL  Comprehensive metabolic panel     Status: Abnormal   Collection Time: 06/03/22  7:20 PM  Result Value Ref Range   Sodium 138 135 - 145 mmol/L   Potassium 3.4 (L) 3.5 - 5.1 mmol/L   Chloride 102 98 - 111 mmol/L   CO2 22 22 - 32 mmol/L   Glucose, Bld 85 70 - 99 mg/dL   BUN 5 (L) 6 - 20 mg/dL   Creatinine, Ser 5.39 0.44 - 1.00 mg/dL   Calcium 9.7 8.9 - 76.7 mg/dL   Total Protein 6.6 6.5 - 8.1 g/dL   Albumin 3.8 3.5 - 5.0 g/dL   AST 16 15 - 41 U/L   ALT 9 0 - 44 U/L   Alkaline Phosphatase 46 38 - 126 U/L   Total Bilirubin 0.4 0.3 - 1.2 mg/dL   GFR, Estimated >34 >19 mL/min   Anion gap 14 5 - 15  ABO/Rh     Status: None   Collection Time: 06/03/22  7:20 PM  Result Value Ref Range   ABO/RH(D)      O POS Performed at [facility], [address] [place], [place] [address]   Wet prep, genital     Status: Abnormal   Collection Time: 06/03/22  7:20 PM   Specimen: Vaginal  Result Value Ref Range   Yeast Wet Prep HPF POC NONE SEEN NONE SEEN   Trich, Wet Prep NONE SEEN NONE SEEN   Clue Cells Wet Prep HPF POC NONE SEEN NONE SEEN   WBC, Wet Prep HPF POC >=10 (A) <10   Sperm NONE SEEN     US OB LESS THAN 14 WEEKS WITH OB TRANSVAGINAL  Result Date: 06/03/2022 CLINICAL DATA:  Vaginal bleeding for 1 day. EXAM: OBSTETRIC <14 WK [place] AND TRANSVAGINAL OB US TECHNIQUE: Both transabdominal and transvaginal ultrasound examinations were performed for complete evaluation of the gestation as well as the maternal uterus, adnexal regions, and pelvic cul-de-sac. Transvaginal technique was performed to assess early pregnancy. COMPARISON:  12/24/2021. FINDINGS:  Intrauterine gestational sac: Single, irregular shape is noted Yolk sac:  Yes Embryo:  Yes Cardiac Activity: Yes Heart Rate: 150 bpm MSD:   mm    w     d CRL:  13.2 mm   7 w   4 d                  [place] EDC: 01/16/2023. Subchorionic hemorrhage: There is a small subchorionic hemorrhage measuring 1.3 x 0.8 cm. Maternal uterus/adnexae: The right ovary is within normal limits. The left ovary is not visualized on exam. No free fluid in the pelvis. IMPRESSION: 1. Single live intrauterine pregnancy with estimated gestational age of [redacted] weeks and 4 days. The gestational sac is irregular in shape which may  be associated with nonviable pregnancy. Correlation with beta HCG and short-term follow-up ultrasound are recommended. 2. Small subchorionic hemorrhage. Electronically Signed   By: Thornell Sartorius M.D.   On: 06/03/2022 20:17     MDM  Wet prep & GC HIV, CBC, Hcg, ABO US OB transvaginal   Report given to Cleone Slim CNM who resumed care of the patient.   -O Pos blood type  Reviewed results of subchorionic hemorrhage with patient. Discussed that this is a common finding in the first trimester and does not usually cause problems in the pregnancy like loss or difficulty with development. Reviewed expectations for vaginal bleeding including a small amount possibly for several weeks. Reviewed warning signs of heavy bleeding, saturating a pad in less than an hour, and severe pain as reasons to come back to MAU. Encouraged patient to exercise pelvic rest until 7 days after bleeding stops. Patient verbalized understanding.   Patient states she has follow up planned with [place] for viability scan and establish care  Assessment and Plan   1. Normal intrauterine pregnancy on prenatal ultrasound in first trimester   2. [redacted] weeks gestation of pregnancy   3. Subchorionic hemorrhage of placenta in first trimester, single or unspecified fetus    -Discharge home in stable condition -Bleeding precautions  discussed -Patient advised to follow-up with OB as scheduled for prenatal care -Patient may return to MAU as needed or if her condition were to change or worsen  Rolm Bookbinder, CNM 06/03/22 8:43 PM

## 2022-06-04 LAB — ABO/RH: ABO/RH(D): O POS

## 2022-06-04 LAB — GC/CHLAMYDIA PROBE AMP (~~LOC~~) NOT AT ARMC
Chlamydia: NEGATIVE
Comment: NEGATIVE
Comment: NORMAL
Neisseria Gonorrhea: NEGATIVE

## 2022-06-18 ENCOUNTER — Ambulatory Visit (HOSPITAL_COMMUNITY)
Payer: No Typology Code available for payment source | Admitting: Student in an Organized Health Care Education/Training Program

## 2022-06-25 ENCOUNTER — Ambulatory Visit (HOSPITAL_COMMUNITY)
Payer: No Typology Code available for payment source | Admitting: Student in an Organized Health Care Education/Training Program

## 2022-07-02 ENCOUNTER — Ambulatory Visit (HOSPITAL_COMMUNITY)
Payer: No Typology Code available for payment source | Admitting: Student in an Organized Health Care Education/Training Program

## 2023-07-27 ENCOUNTER — Other Ambulatory Visit (HOSPITAL_COMMUNITY): Payer: Self-pay

## 2023-07-27 MED ORDER — BUPRENORPHINE HCL-NALOXONE HCL 8-2 MG SL FILM
1.0000 | ORAL_FILM | Freq: Two times a day (BID) | SUBLINGUAL | 0 refills | Status: DC
  Filled 2023-07-27: qty 60, 30d supply, fill #0

## 2023-08-26 ENCOUNTER — Other Ambulatory Visit (HOSPITAL_COMMUNITY): Payer: Self-pay

## 2023-08-26 MED ORDER — BUPRENORPHINE HCL-NALOXONE HCL 8-2 MG SL FILM
1.0000 | ORAL_FILM | Freq: Two times a day (BID) | SUBLINGUAL | 0 refills | Status: DC
  Filled 2023-08-26: qty 60, 30d supply, fill #0

## 2023-09-23 ENCOUNTER — Other Ambulatory Visit (HOSPITAL_COMMUNITY): Payer: Self-pay

## 2023-09-23 MED ORDER — BUPRENORPHINE HCL-NALOXONE HCL 8-2 MG SL FILM
1.0000 | ORAL_FILM | Freq: Two times a day (BID) | SUBLINGUAL | 0 refills | Status: DC
  Filled 2023-09-23: qty 60, 30d supply, fill #0

## 2023-09-23 MED ORDER — NALOXONE HCL 4 MG/0.1ML NA LIQD
1.0000 | NASAL | 0 refills | Status: AC | PRN
  Filled 2023-09-23: qty 2, 1d supply, fill #0

## 2023-09-24 ENCOUNTER — Other Ambulatory Visit (HOSPITAL_COMMUNITY): Payer: Self-pay

## 2023-10-23 ENCOUNTER — Other Ambulatory Visit (HOSPITAL_COMMUNITY): Payer: Self-pay

## 2023-10-23 MED ORDER — BUPRENORPHINE HCL-NALOXONE HCL 8-2 MG SL FILM
1.0000 | ORAL_FILM | Freq: Two times a day (BID) | SUBLINGUAL | 0 refills | Status: DC
  Filled 2023-10-23: qty 60, 30d supply, fill #0

## 2023-11-25 ENCOUNTER — Other Ambulatory Visit (HOSPITAL_COMMUNITY): Payer: Self-pay

## 2023-11-25 MED ORDER — BUPRENORPHINE HCL-NALOXONE HCL 8-2 MG SL FILM
1.0000 | ORAL_FILM | Freq: Two times a day (BID) | SUBLINGUAL | 0 refills | Status: DC
  Filled 2023-11-25: qty 60, 30d supply, fill #0

## 2023-12-25 ENCOUNTER — Other Ambulatory Visit (HOSPITAL_COMMUNITY): Payer: Self-pay

## 2023-12-25 MED ORDER — BUPRENORPHINE HCL-NALOXONE HCL 8-2 MG SL FILM
1.0000 | ORAL_FILM | Freq: Two times a day (BID) | SUBLINGUAL | 0 refills | Status: DC
  Filled 2023-12-25: qty 60, 30d supply, fill #0

## 2024-01-01 ENCOUNTER — Encounter (HOSPITAL_COMMUNITY): Payer: Self-pay | Admitting: *Deleted

## 2024-01-01 ENCOUNTER — Other Ambulatory Visit: Payer: Self-pay

## 2024-01-01 ENCOUNTER — Emergency Department (HOSPITAL_COMMUNITY)
Admission: EM | Admit: 2024-01-01 | Discharge: 2024-01-02 | Disposition: A | Discharge status: Home / Self Care | Attending: Emergency Medicine | Admitting: Emergency Medicine

## 2024-01-01 DIAGNOSIS — K5900 Constipation, unspecified: Secondary | ICD-10-CM | POA: Insufficient documentation

## 2024-01-01 DIAGNOSIS — R1011 Right upper quadrant pain: Secondary | ICD-10-CM | POA: Diagnosis present

## 2024-01-01 LAB — URINALYSIS, ROUTINE W REFLEX MICROSCOPIC
Bilirubin Urine: NEGATIVE
Glucose, UA: NEGATIVE mg/dL
Hgb urine dipstick: NEGATIVE
Ketones, ur: 5 mg/dL — AB
Leukocytes,Ua: NEGATIVE
Nitrite: NEGATIVE
Protein, ur: NEGATIVE mg/dL
Specific Gravity, Urine: 1.017 (ref 1.005–1.030)
pH: 5 (ref 5.0–8.0)

## 2024-01-01 LAB — COMPREHENSIVE METABOLIC PANEL WITH GFR
ALT: 12 U/L (ref 0–44)
AST: 16 U/L (ref 15–41)
Albumin: 4.2 g/dL (ref 3.5–5.0)
Alkaline Phosphatase: 48 U/L (ref 38–126)
Anion gap: 12 (ref 5–15)
BUN: 7 mg/dL (ref 6–20)
CO2: 24 mmol/L (ref 22–32)
Calcium: 9.2 mg/dL (ref 8.9–10.3)
Chloride: 99 mmol/L (ref 98–111)
Creatinine, Ser: 0.86 mg/dL (ref 0.44–1.00)
GFR, Estimated: >60 mL/min (ref >60)
Glucose, Bld: 119 mg/dL — ABNORMAL HIGH (ref 70–99)
Potassium: 3.4 mmol/L — ABNORMAL LOW (ref 3.5–5.1)
Sodium: 135 mmol/L (ref 135–145)
Total Bilirubin: 0.5 mg/dL (ref 0.0–1.2)
Total Protein: 7.2 g/dL (ref 6.5–8.1)

## 2024-01-01 LAB — CBC
HCT: 38 % (ref 36.0–46.0)
Hemoglobin: 12.7 g/dL (ref 12.0–15.0)
MCH: 27.4 pg (ref 26.0–34.0)
MCHC: 33.4 g/dL (ref 30.0–36.0)
MCV: 81.9 fL (ref 80.0–100.0)
Platelets: 275 10*3/uL (ref 150–400)
RBC: 4.64 MIL/uL (ref 3.87–5.11)
RDW: 12.2 % (ref 11.5–15.5)
WBC: 9.2 10*3/uL (ref 4.0–10.5)
nRBC: 0 % (ref 0.0–0.2)

## 2024-01-01 LAB — HCG, SERUM, QUALITATIVE: Preg, Serum: NEGATIVE

## 2024-01-01 LAB — LIPASE, BLOOD: Lipase: 22 U/L (ref 11–51)

## 2024-01-01 NOTE — ED Triage Notes (Signed)
 The pt has had abd pain since yesterday  with nausea lmp 2 weeks ago

## 2024-01-02 ENCOUNTER — Emergency Department (HOSPITAL_COMMUNITY)

## 2024-01-02 MED ORDER — IOHEXOL 350 MG/ML SOLN
75.0000 mL | Freq: Once | INTRAVENOUS | Status: AC | PRN
  Administered 2024-01-02: 75 mL via INTRAVENOUS

## 2024-01-02 MED ORDER — SODIUM CHLORIDE 0.9 % IV BOLUS
1000.0000 mL | Freq: Once | INTRAVENOUS | Status: AC
  Administered 2024-01-02: 1000 mL via INTRAVENOUS

## 2024-01-02 MED ORDER — POLYETHYLENE GLYCOL 3350 17 G PO PACK: 17.0000 g | PACK | Freq: Every day | ORAL | 0 refills | Status: AC

## 2024-01-02 MED ORDER — DOCUSATE SODIUM 250 MG PO CAPS: 250.0000 mg | ORAL_CAPSULE | Freq: Two times a day (BID) | ORAL | 0 refills | Status: AC

## 2024-01-02 NOTE — ED Provider Notes (Signed)
 Sandra Ritter EMERGENCY DEPARTMENT AT [facility] Provider Note   CSN: [id number] Arrival date & time: 01/01/24  2028     History  Chief Complaint  Patient presents with   Abdominal Pain    Sandra Ritter is a 33 y.o. female.  Patient without any past medical history is reporting to emergency room with complaint of abdominal pain, location is primarily right upper and right lower side of abdomen.  Reports that last bowel movement was several days ago.  She has not had some nausea but no vomiting or diarrhea.  Patient reports that she tried an enema with mild bowel movement but without improvement in symptoms.  She has not had fever and she is tolerating oral intake.   Abdominal Pain      Home Medications Prior to Admission medications   Medication Sig Start Date End Date Taking? Authorizing Provider  buprenorphine  (SUBUTEX ) 2 MG SUBL SL tablet Place 8 mg under the tongue 3 (three) times daily before meals.    [provider]  Buprenorphine  HCl-Naloxone  HCl 8-2 MG FILM Place 1 Film under the tongue 2 (two) times daily. 12/25/23     naloxone  (NARCAN ) nasal spray 4 mg/0.1 mL Place 1 spray every 2 minutes into one nostril as needed for opioid overdose. Alternate nostrils as needed until help arrives. 09/23/23         Allergies    Patient has no known allergies.    Review of Systems   Review of Systems  Gastrointestinal:  Positive for abdominal pain.    Physical Exam Updated Vital Signs BP 126/87   Pulse 79   Temp 98.4 F (36.9 C)   Resp 16   Ht 5' 5.75" (1.67 m)   Wt 77.8 kg   LMP 12/25/2023   SpO2 100%   BMI 27.89 kg/m  Physical Exam  ED Results / Procedures / Treatments   Labs (all labs ordered are listed, but only abnormal results are displayed) Labs Reviewed  COMPREHENSIVE METABOLIC PANEL WITH GFR - Abnormal; Notable for the following components:      Result Value   Potassium 3.4 (*)    Glucose, Bld 119 (*)    All other components within  normal limits  URINALYSIS, ROUTINE W REFLEX MICROSCOPIC - Abnormal; Notable for the following components:   APPearance HAZY (*)    Ketones, ur 5 (*)    All other components within normal limits  LIPASE, BLOOD  CBC  HCG, SERUM, QUALITATIVE    EKG None  Radiology CT ABDOMEN PELVIS W CONTRAST Result Date: 01/02/2024 EXAM:  CT ABDOMEN PELVIS WITH IV CONTRAST INDICATION:  RLQ abdominal pain TECHNIQUE: Spiral CT scanning was performed through the abdomen and pelvis after the patient received intravenous Omnipaque 350, 75 mL. FINDINGS: There is no significant abnormality identified in the lung bases, liver, spleen, pancreas and gallbladder. No calculus, obstruction, or soft tissue mass is present involving the kidneys. There are no adrenal masses. Moderate stool retention is present in the transverse and right colon. There is no bowel dilatation. There is no evidence of ascites or adenopathy. No abdominal aortic aneurysm is present. The bladder, uterus and adnexa have a normal appearance. Mild free fluid is present in the cul-de-sac, most likely physiologic. There is no inguinal hernia. The pelvic bowel loops have a normal appearance. The appendix has a normal appearance. There is no fracture or bone destruction. IMPRESSION: 1. No evidence of acute process. Normal appendix. 2. Retained stool in the transverse and right  colon, consistent with constipation. Please note that CT scanning at this site utilizes multiple dose reduction techniques, including automatic exposure control, adjustment of the MAA and/or KVP according to the patient's size, and use of iterative reconstruction. Electronically signed by: Buster Cash MD 01/02/2024 02:48 PM EDT RP Workstation: [id number]    Procedures Procedures    Medications Ordered in ED Medications  sodium chloride  0.9 % bolus 1,000 mL (1,000 mLs Intravenous New Bag/Given 01/02/24 1304)  iohexol (OMNIPAQUE) 350 MG/ML injection 75 mL (75 mLs Intravenous Contrast  Given 01/02/24 1437)    ED Course/ Medical Decision Making/ A&P Clinical Course as of 01/02/24 1509  Sat Jan 02, 2024  1213 Working on US  IV, IV team called. Patient refusing pain medication and refusing to let staff re attempt IV. She agrees to let IV team look for vein one last time. [JB]    Clinical Course User Index [JB] Keundra Petrucelli, Kandace Organ, PA-C                                 Medical Decision Making Amount and/or Complexity of Data Reviewed Radiology: ordered.  Risk Prescription drug management.   This patient presents to the ED for concern of abdominal pain, this involves an extensive number of treatment options, and is a complaint that carries with it a high risk of complications and morbidity.  The differential diagnosis includes pancreatitis, appendicitis, cholecystitis, gastroenteritis, constipation, small bowel obstruction   Co morbidities that complicate the patient evaluation  History of substance abuse    Lab Tests:  I personally interpreted labs.  The pertinent results include:   CBC without leukocytosis and no anemia.  CMP without electrolyte abnormality.  AST and ALT within normal limits.  Normal kidney function. Lipase 22. hCG Negative. Ua negative.    Imaging Studies ordered:  I ordered imaging studies including CT abdomen and pelvis I independently visualized and interpreted imaging which showed constipation.  No orther acute findings. I agree with the radiologist interpretation   Cardiac Monitoring: / EKG:  The patient was maintained on a cardiac monitor.     Problem List / ED Course / Critical interventions / Medication management  Patient reporting to emergency room with complaint of right sided abdominal pain.  She is hemodynamically stable and well-appearing.  She has had some change in bowel movements and noted some constipation. She is passing gas and she is tolerating oral intake.  Otherwise she has had some nausea but tolerating oral intake  without vomiting.  Lab work is overall reassuring without leukocytosis.  She denies any urinary symptoms. On exam she has reproducible tenderness in right upper and right lower quadrant.  CT scan does show constipation within transverse and descending colon - feel this is assistant with patient's symptoms and location of pain.  Symptoms are not consistent with bowel obstruction.  Offered medication here, patient declined.  Will send patient home with docusate, MiraLAX  and mag citrate for bowel movement.  Patient will follow-up with primary care.  Given return precautions. Patient is adamantly declining any medication. Given 1L NS.   Plan  F/u w/ PCP in 2-3d to ensure resolution of sx.  Patient was given return precautions. Patient stable for discharge at this time.  Patient educated on sx/dx and verbalized understanding of plan. Return to ER w/ new or worsening sx.          Final Clinical Impression(s) / ED Diagnoses Final diagnoses:  Constipation, unspecified constipation type    Rx / DC Orders ED Discharge Orders          Ordered    docusate sodium (COLACE) 250 MG capsule  2 times daily        01/02/24 1511    polyethylene glycol (MIRALAX ) 17 g packet  Daily        01/02/24 1511              Weslie Rasmus, Kandace Organ, PA-C 01/02/24 1525    Trish Furl, MD 01/03/24 [phone]

## 2024-01-02 NOTE — Discharge Instructions (Addendum)
 You are seen emergency room today.  Your CT scan shows constipation.  I would recommend taking docusate 1-2 times a day.  You can also use MiraLAX  as needed for bowel movement.  If these medicines do not work I would recommend using the milk of magnesium/ Mag citrate as discussed. Please follow-up with your primary care doctor.

## 2024-01-25 ENCOUNTER — Other Ambulatory Visit (HOSPITAL_COMMUNITY): Payer: Self-pay

## 2024-01-25 MED ORDER — BUPRENORPHINE HCL-NALOXONE HCL 8-2 MG SL FILM
1.0000 | ORAL_FILM | Freq: Two times a day (BID) | SUBLINGUAL | 0 refills | Status: DC
  Filled 2024-01-25: qty 60, 30d supply, fill #0

## 2024-02-26 ENCOUNTER — Other Ambulatory Visit (HOSPITAL_COMMUNITY): Payer: Self-pay

## 2024-02-26 MED ORDER — BUPRENORPHINE HCL-NALOXONE HCL 8-2 MG SL FILM
1.0000 | ORAL_FILM | Freq: Two times a day (BID) | SUBLINGUAL | 0 refills | Status: DC
  Filled 2024-02-26: qty 60, 30d supply, fill #0

## 2024-03-30 ENCOUNTER — Other Ambulatory Visit (HOSPITAL_COMMUNITY): Payer: Self-pay

## 2024-03-30 MED ORDER — BUPRENORPHINE HCL-NALOXONE HCL 8-2 MG SL FILM
1.0000 | ORAL_FILM | Freq: Two times a day (BID) | SUBLINGUAL | 0 refills | Status: DC
  Filled 2024-03-30: qty 60, 30d supply, fill #0

## 2024-04-29 ENCOUNTER — Other Ambulatory Visit (HOSPITAL_COMMUNITY): Payer: Self-pay

## 2024-04-29 MED ORDER — BUPRENORPHINE HCL-NALOXONE HCL 8-2 MG SL FILM
1.0000 | ORAL_FILM | Freq: Two times a day (BID) | SUBLINGUAL | 0 refills | Status: DC
  Filled 2024-04-29: qty 60, 30d supply, fill #0

## 2024-04-30 IMAGING — US US OB < 14 WEEKS - US OB TV
1 series · 15 of 28 positions shown · non-contrast
Comparison: None Available.

CLINICAL DATA: Vaginal bleeding with positive pregnancy test.

EXAM:
OBSTETRIC <14 WK US AND TRANSVAGINAL OB US
TECHNIQUE: Both transabdominal and transvaginal ultrasound examinations were
performed for complete evaluation of the gestation as well as the
maternal uterus, adnexal regions, and pelvic cul-de-sac.
Transvaginal technique was performed to assess early pregnancy.

[Series 1: us ob < 14 weeks - us ob tv · 64 acquisitions, 15 frames shown]
[im 1/64]
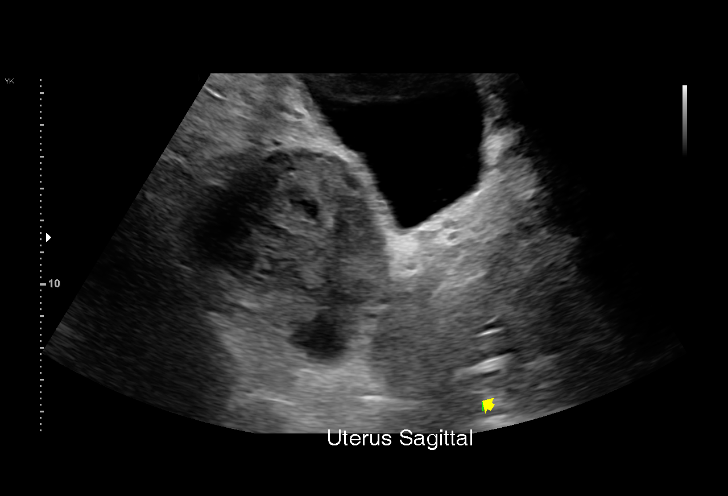
[im 5/64]
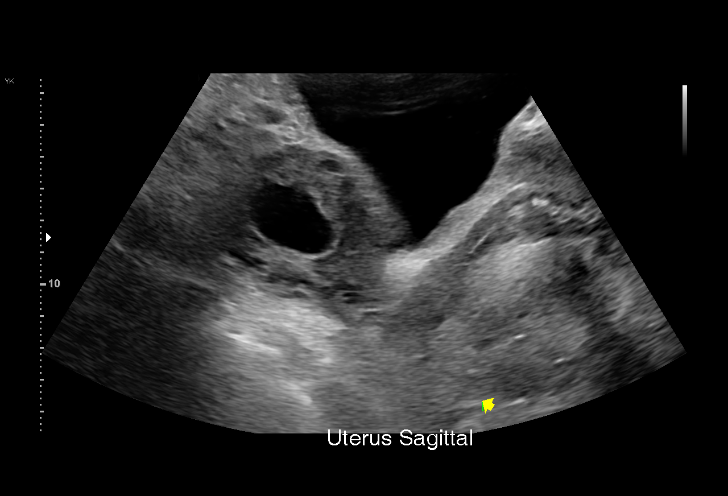
[im 10/64]
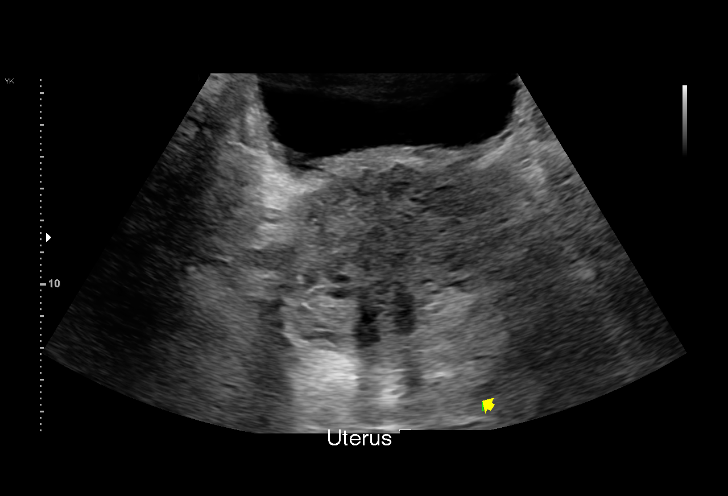
[im 15/64]
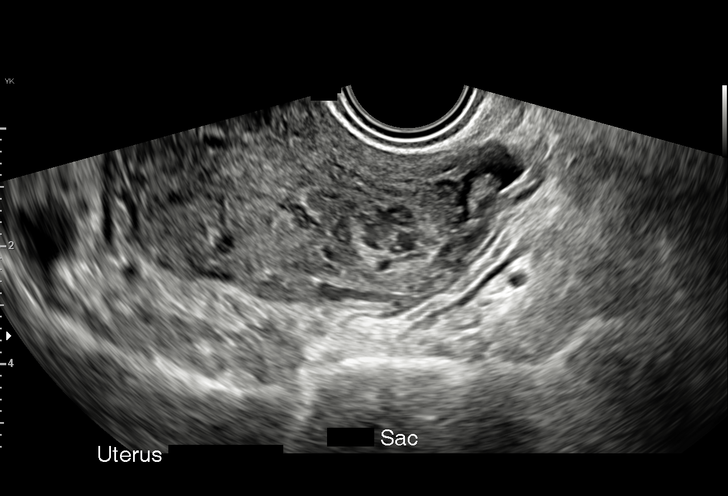
[im 19/64]
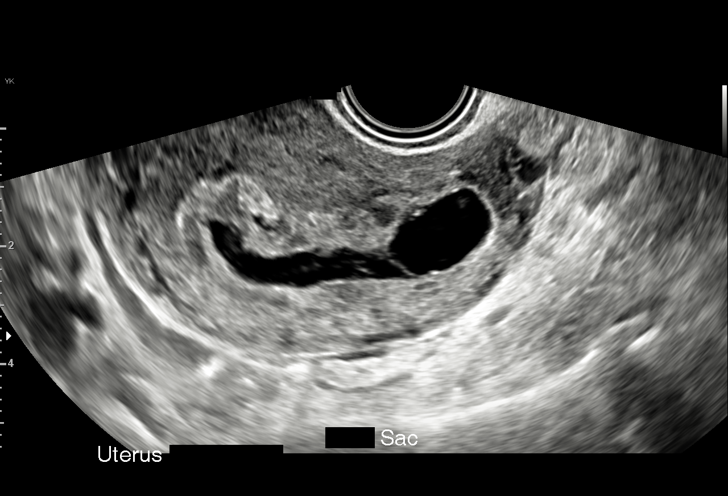
[im 24/64]
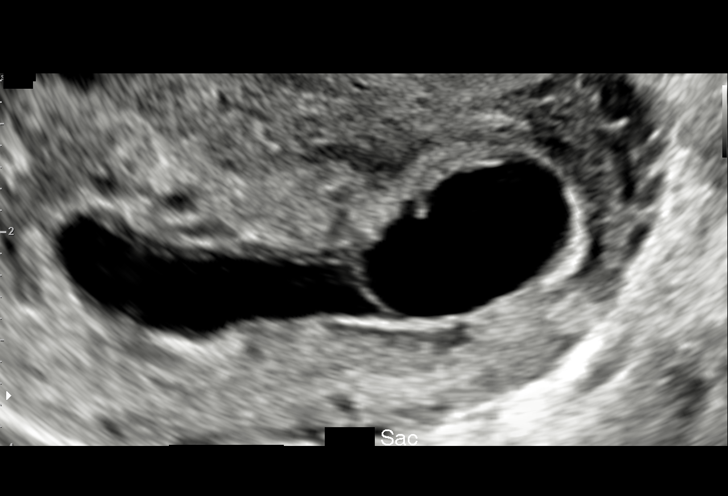
[im 29/64]
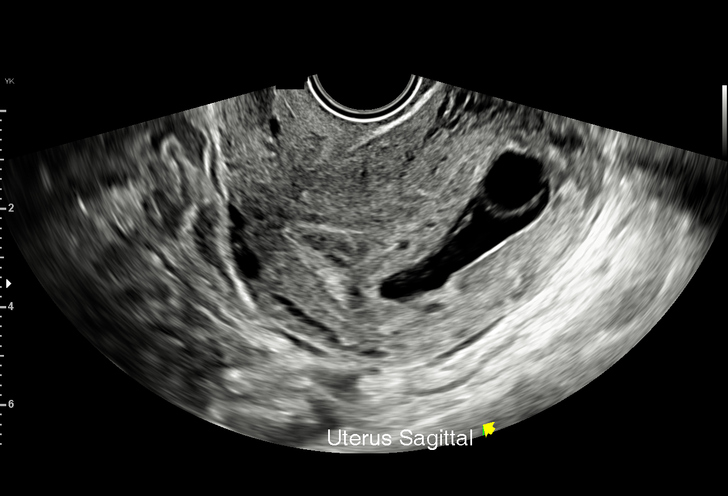
[im 33/64]
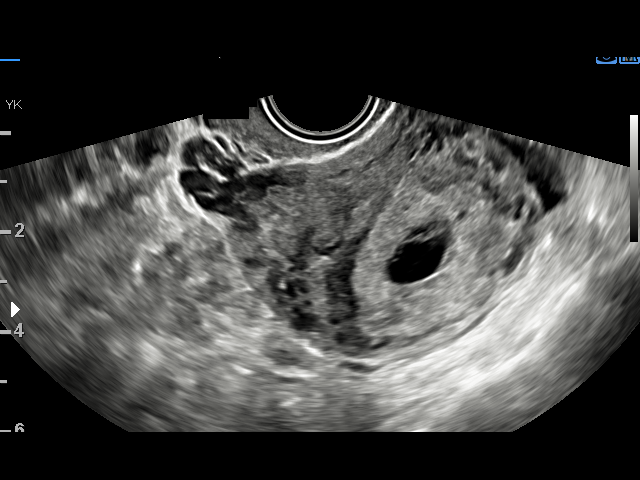
[im 36/64]
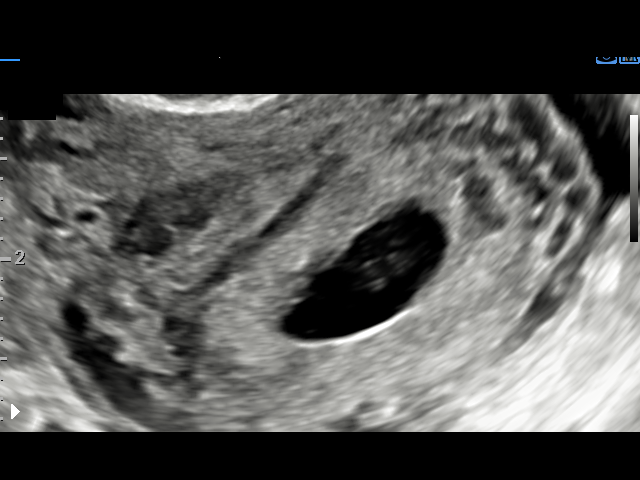
[im 40/64]
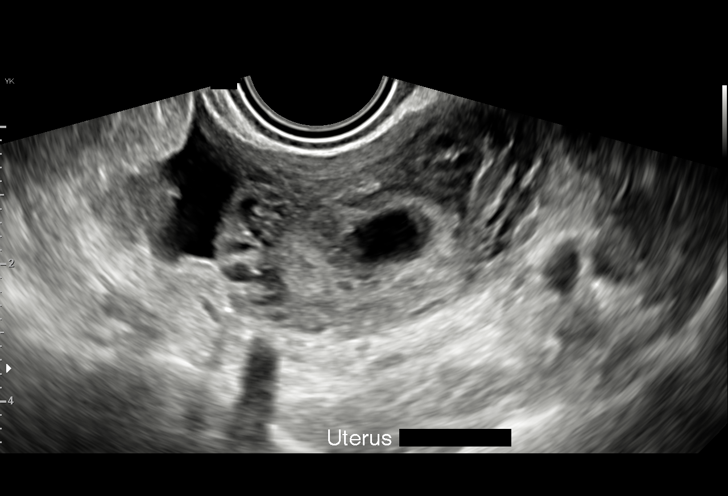
[im 45/64]
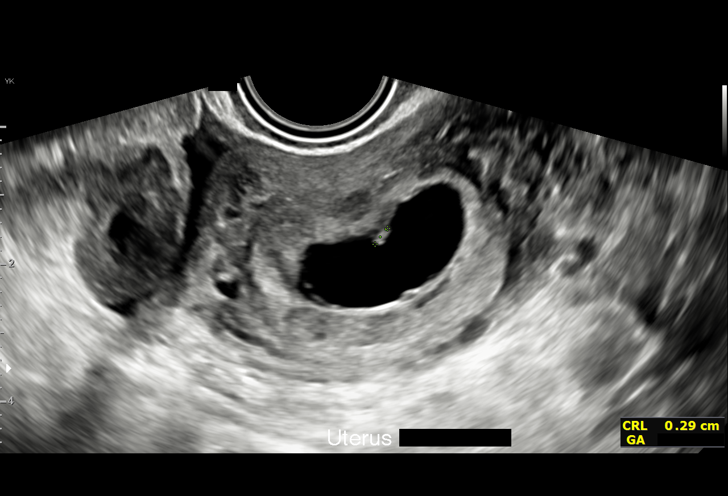
[im 50/64]
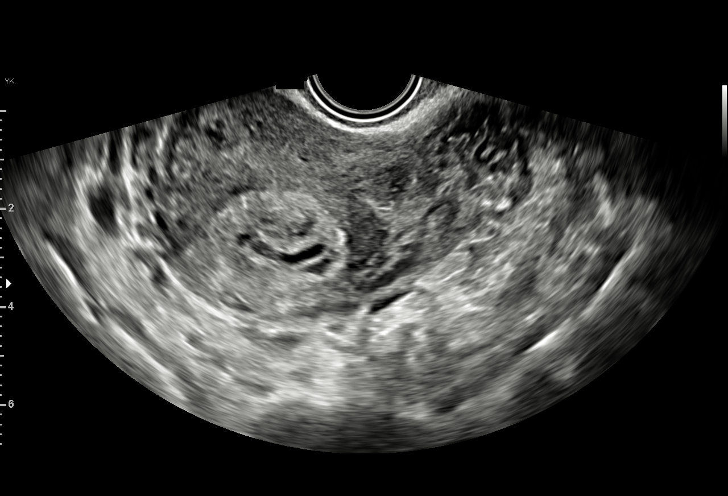
[im 54/64]
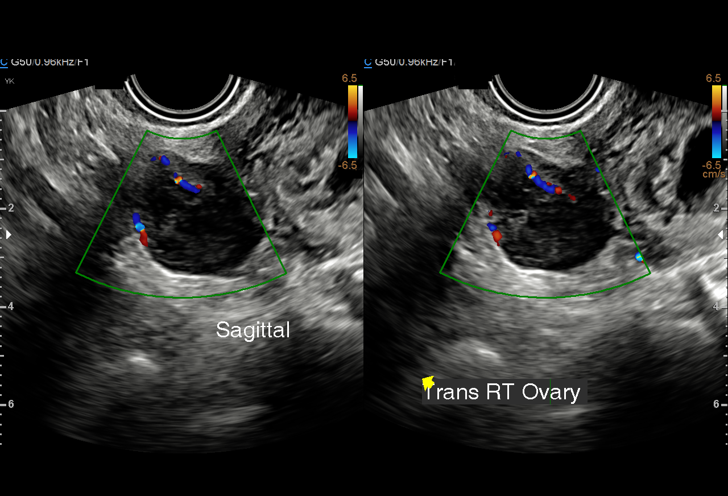
[im 59/64]
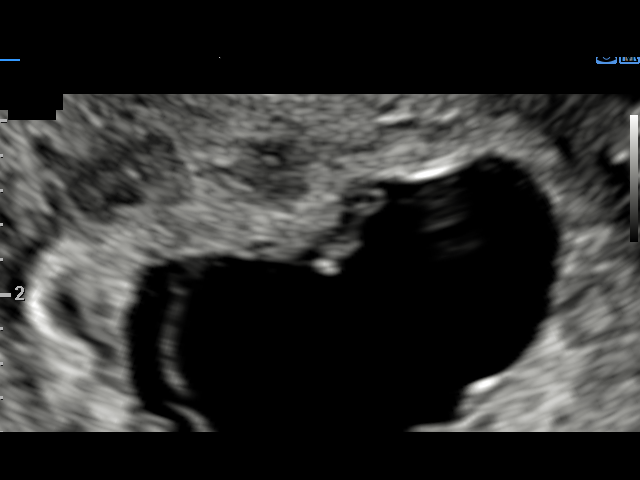
[im 64/64]
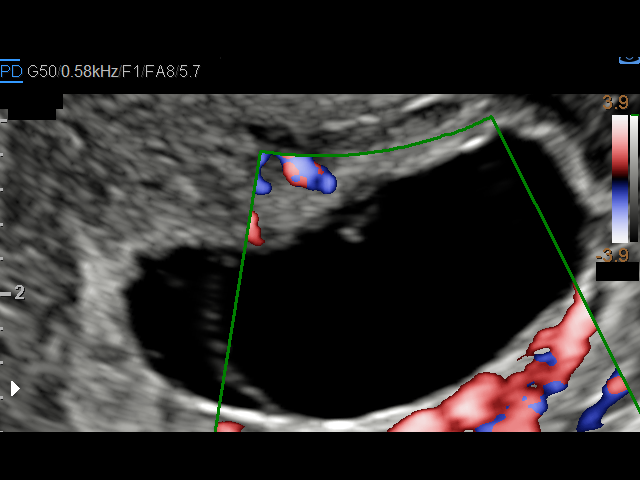

[15 of 28 positions shown; findings below may reference images not displayed]

FINDINGS: Intrauterine gestational sac: Single. The sac is irregular and
elongated with internal septation or potentially visualized amnion.

Yolk sac:  Not definitely visualized

Embryo:  Possible tiny embryo at 3 mm crown-rump length.

Cardiac Activity: Nonvisualized

Heart Rate: N/A  bpm

MSD: 29 mm   7 w   6 d

CRL:  2.6 mm   5 w   5 d

Subchorionic hemorrhage:  None visualized

Maternal uterus/adnexae: Maternal ovaries unremarkable.
IMPRESSION: Elongated irregular gestational sac with potential tiny fetal pole
but no yolk sac visible. Assuming visible fetal pole with no
demonstrable heartbeat, study is considered suspicious for
non-viability. The enlarged irregular gestational sac would also
support non-viability. Findings are suspicious for failed pregnancy.

## 2024-05-30 ENCOUNTER — Other Ambulatory Visit (HOSPITAL_COMMUNITY): Payer: Self-pay

## 2024-05-30 MED ORDER — BUPRENORPHINE HCL-NALOXONE HCL 8-2 MG SL FILM
1.0000 | ORAL_FILM | Freq: Two times a day (BID) | SUBLINGUAL | 0 refills | Status: DC
  Filled 2024-05-30: qty 60, 30d supply, fill #0

## 2024-06-30 ENCOUNTER — Other Ambulatory Visit (HOSPITAL_COMMUNITY): Payer: Self-pay

## 2024-06-30 MED ORDER — BUPRENORPHINE HCL-NALOXONE HCL 8-2 MG SL FILM
1.0000 | ORAL_FILM | Freq: Two times a day (BID) | SUBLINGUAL | 0 refills | Status: DC
  Filled 2024-06-30: qty 60, 30d supply, fill #0

## 2024-08-03 ENCOUNTER — Other Ambulatory Visit (HOSPITAL_COMMUNITY): Payer: Self-pay

## 2024-08-03 MED ORDER — BUPRENORPHINE HCL-NALOXONE HCL 8-2 MG SL FILM
1.0000 | ORAL_FILM | Freq: Two times a day (BID) | SUBLINGUAL | 0 refills | Status: DC
  Filled 2024-08-03: qty 60, 30d supply, fill #0

## 2024-09-02 ENCOUNTER — Other Ambulatory Visit (HOSPITAL_COMMUNITY): Payer: Self-pay

## 2024-09-02 MED ORDER — CHLORDIAZEPOXIDE HCL 25 MG PO CAPS
25.0000 mg | ORAL_CAPSULE | Freq: Three times a day (TID) | ORAL | 0 refills | Status: AC | PRN
  Filled 2024-09-02: qty 30, 10d supply, fill #0

## 2024-09-02 MED ORDER — BUPRENORPHINE HCL-NALOXONE HCL 8-2 MG SL FILM
1.0000 | ORAL_FILM | Freq: Two times a day (BID) | SUBLINGUAL | 0 refills | Status: AC
  Filled 2024-09-02: qty 60, 30d supply, fill #0
